# Patient Record
Sex: Male | Born: 1990
Health system: Southern US, Community
[De-identification: ages and names within clinical notes are randomized; demographics above are authoritative.]

## PROBLEM LIST (undated history)

## (undated) DIAGNOSIS — G4730 Sleep apnea, unspecified: Secondary | ICD-10-CM

## (undated) DIAGNOSIS — K219 Gastro-esophageal reflux disease without esophagitis: Secondary | ICD-10-CM

## (undated) HISTORY — DX: Gastro-esophageal reflux disease without esophagitis: K21.9

---

## 2009-09-02 ENCOUNTER — Emergency Department (HOSPITAL_BASED_OUTPATIENT_CLINIC_OR_DEPARTMENT_OTHER): Admission: EM | Admit: 2009-09-02 | Discharge: 2009-09-02 | Payer: Self-pay | Admitting: Emergency Medicine

## 2011-11-10 ENCOUNTER — Encounter (HOSPITAL_COMMUNITY): Payer: Self-pay | Admitting: *Deleted

## 2011-11-10 ENCOUNTER — Emergency Department (HOSPITAL_COMMUNITY)
Admission: EM | Admit: 2011-11-10 | Discharge: 2011-11-10 | Disposition: A | Discharge status: Home / Self Care | Payer: Self-pay | Attending: Emergency Medicine | Admitting: Emergency Medicine

## 2011-11-10 ENCOUNTER — Emergency Department (HOSPITAL_COMMUNITY): Payer: Self-pay

## 2011-11-10 DIAGNOSIS — R05 Cough: Secondary | ICD-10-CM

## 2011-11-10 DIAGNOSIS — J069 Acute upper respiratory infection, unspecified: Secondary | ICD-10-CM | POA: Insufficient documentation

## 2011-11-10 DIAGNOSIS — Z79899 Other long term (current) drug therapy: Secondary | ICD-10-CM | POA: Insufficient documentation

## 2011-11-10 DIAGNOSIS — J029 Acute pharyngitis, unspecified: Secondary | ICD-10-CM | POA: Insufficient documentation

## 2011-11-10 DIAGNOSIS — R059 Cough, unspecified: Secondary | ICD-10-CM | POA: Insufficient documentation

## 2011-11-10 MED ORDER — HYDROCOD POLST-CHLORPHEN POLST 10-8 MG/5ML PO LQCR: 5.0000 mL | Freq: Two times a day (BID) | ORAL | Status: DC | PRN

## 2011-11-10 MED ORDER — AZITHROMYCIN 250 MG PO TABS: ORAL_TABLET | ORAL | Status: DC

## 2011-11-10 NOTE — ED Provider Notes (Signed)
History     CSN: [id number]  Arrival date & time 11/10/11  1830   First MD Initiated Contact with Patient 11/10/11 2022      Chief Complaint  Patient presents with  . URI  . Sore Throat    (Consider location/radiation/quality/duration/timing/severity/associated sxs/prior treatment) HPI Comments: URI sx for 2 weeks. Cough, nasal congestion and sore throat, Improvement noted 1 week ago, then worsening. No fever, ear pain, HA, N/V/D. Cough non-productive. Mucinex and other OTC meds at home without much relief. Patient's son is sick. Onset gradual. Course constant. Nothing makes symptoms worse.   Patient is a 21 y.o. male presenting with URI and pharyngitis. The history is provided by the patient.  URI  Sore Throat    History reviewed. No pertinent past medical history.  History reviewed. No pertinent past surgical history.  No family history on file.  History  Substance Use Topics  . Smoking status: Never Smoker   . Smokeless tobacco: Not on file  . Alcohol Use: Yes      Review of Systems  All other systems reviewed and are negative.    Allergies  Review of patient's allergies indicates no known allergies.  Home Medications   Current Outpatient Rx  Name Route Sig Dispense Refill  . VICKS VAPORUB EX Apply externally Apply 1 application topically daily as needed. For cold symptoms    . MUCINEX PO Oral Take 2 tablets by mouth every 12 (twelve) hours as needed. For cold symptoms      BP 137/78  Pulse 66  Temp 98.1 F (36.7 C) (Oral)  Resp 20  Ht 6\' 1"  (1.854 m)  Wt 208 lb (94.348 kg)  BMI 27.44 kg/m2  SpO2 99%  Physical Exam  Nursing note and vitals reviewed. Constitutional: He appears well-developed and well-nourished.  HENT:  Head: Normocephalic and atraumatic. No trismus in the jaw.  Right Ear: Tympanic membrane, external ear and ear canal normal.  Left Ear: Tympanic membrane, external ear and ear canal normal.  Nose: Mucosal edema and rhinorrhea  present. Right sinus exhibits maxillary sinus tenderness. Left sinus exhibits maxillary sinus tenderness.  Mouth/Throat: Uvula is midline and mucous membranes are normal. Mucous membranes are not dry. No uvula swelling. Posterior oropharyngeal erythema present. No oropharyngeal exudate, posterior oropharyngeal edema or tonsillar abscesses.  Eyes: Conjunctivae normal are normal. Right eye exhibits no discharge. Left eye exhibits no discharge.  Neck: Normal range of motion. Neck supple.  Cardiovascular: Normal rate, regular rhythm and normal heart sounds.   Pulmonary/Chest: Effort normal and breath sounds normal. No respiratory distress. He has no wheezes. He has no rales.  Abdominal: Soft. There is no tenderness.  Neurological: He is alert.  Skin: Skin is warm and dry.  Psychiatric: He has a normal mood and affect.    ED Course  Procedures (including critical care time)  Labs Reviewed - No data to display Dg Chest 2 View  11/10/2011  *RADIOLOGY REPORT*  Clinical Data: 21 year old male upper respiratory tract infection. Cough congestion, sore throat.  CHEST - 2 VIEW  Comparison: None.  Findings: Normal lung volumes. Normal cardiac size and mediastinal contours.  Visualized tracheal air column is within normal limits. Mild diffuse increased interstitial markings, but otherwise no abnormal pulmonary opacity.  No pneumothorax or effusion. No acute osseous abnormality identified.  IMPRESSION: Mild diffuse increased interstitial markings suspicious for viral / atypical respiratory infection in this setting.  No focal pneumonia.   Original Report Authenticated By: Harley Hallmark, M.D.  1. URI (upper respiratory infection)   2. Cough     8:37 PM Patient seen and examined. Informed of CXR results.   Vital signs reviewed and are as follows: Filed Vitals:   11/10/11 1848  BP: 137/78  Pulse: 66  Temp: 98.1 F (36.7 C)  Resp: 20   Patient counseled on supportive care for URI and s/s to  return including worsening symptoms, persistent fever, persistent vomiting, or if they have any other concerns.  Urged to see PCP if symptoms persist for more than 3 days. Patient verbalizes understanding and agrees with plan.   Patient counseled on use of narcotic cough medications. Counseled not to combine these medications with others containing tylenol. Urged not to drink alcohol, drive, or perform any other activities that requires focus while taking these medications. The patient verbalizes understanding and agrees with the plan.     MDM  Sinusitis/bronchitis: given duration and pattern, will treat with abx. Patient appears non-toxic and otherwise well.        Renne Crigler, [place] 11/12/11 [phone]

## 2011-11-10 NOTE — ED Notes (Signed)
Patient reports he has had cold sx for 2 weeks.  He has also had a sore throat.  He reports he felt light headed while at work.  Patient reports he has a cough with yellow/green production.  Patient denies back pain

## 2011-11-13 NOTE — ED Provider Notes (Signed)
Medical screening examination/treatment/procedure(s) were performed by non-physician practitioner and as supervising physician I was immediately available for consultation/collaboration.  Martha K Linker, MD 11/13/11 1205 

## 2012-01-08 ENCOUNTER — Encounter (HOSPITAL_COMMUNITY): Payer: Self-pay | Admitting: *Deleted

## 2012-01-08 ENCOUNTER — Emergency Department (HOSPITAL_COMMUNITY)
Admission: EM | Admit: 2012-01-08 | Discharge: 2012-01-08 | Disposition: A | Discharge status: Home / Self Care | Payer: Self-pay | Attending: Emergency Medicine | Admitting: Emergency Medicine

## 2012-01-08 DIAGNOSIS — N342 Other urethritis: Secondary | ICD-10-CM | POA: Insufficient documentation

## 2012-01-08 DIAGNOSIS — N509 Disorder of male genital organs, unspecified: Secondary | ICD-10-CM | POA: Insufficient documentation

## 2012-01-08 DIAGNOSIS — R300 Dysuria: Secondary | ICD-10-CM | POA: Insufficient documentation

## 2012-01-08 DIAGNOSIS — A749 Chlamydial infection, unspecified: Secondary | ICD-10-CM | POA: Insufficient documentation

## 2012-01-08 DIAGNOSIS — N453 Epididymo-orchitis: Secondary | ICD-10-CM | POA: Insufficient documentation

## 2012-01-08 DIAGNOSIS — Z79899 Other long term (current) drug therapy: Secondary | ICD-10-CM | POA: Insufficient documentation

## 2012-01-08 DIAGNOSIS — N451 Epididymitis: Secondary | ICD-10-CM

## 2012-01-08 LAB — URINALYSIS, ROUTINE W REFLEX MICROSCOPIC
Bilirubin Urine: NEGATIVE
Hgb urine dipstick: NEGATIVE
Nitrite: NEGATIVE
Specific Gravity, Urine: 1.02 (ref 1.005–1.030)
pH: 6 (ref 5.0–8.0)

## 2012-01-08 LAB — CBC WITH DIFFERENTIAL/PLATELET
Hemoglobin: 15.8 g/dL (ref 13.0–17.0)
Lymphocytes Relative: 19 % (ref 12–46)
Lymphs Abs: 1.3 10*3/uL (ref 0.7–4.0)
Monocytes Relative: 7 % (ref 3–12)
Neutro Abs: 5 10*3/uL (ref 1.7–7.7)
Neutrophils Relative %: 73 % (ref 43–77)
RBC: 5.42 MIL/uL (ref 4.22–5.81)
WBC: 7 10*3/uL (ref 4.0–10.5)

## 2012-01-08 LAB — URINE MICROSCOPIC-ADD ON

## 2012-01-08 LAB — BASIC METABOLIC PANEL
BUN: 16 mg/dL (ref 6–23)
Chloride: 103 mEq/L (ref 96–112)
Glucose, Bld: 98 mg/dL (ref 70–99)
Potassium: 4.2 mEq/L (ref 3.5–5.1)

## 2012-01-08 MED ORDER — HYDROCODONE-ACETAMINOPHEN 5-325 MG PO TABS
1.0000 | ORAL_TABLET | Freq: Once | ORAL | Status: AC
  Administered 2012-01-08: 1 via ORAL
  Filled 2012-01-08: qty 1

## 2012-01-08 MED ORDER — DOXYCYCLINE HYCLATE 100 MG PO TABS
100.0000 mg | ORAL_TABLET | Freq: Once | ORAL | Status: AC
  Administered 2012-01-08: 100 mg via ORAL
  Filled 2012-01-08: qty 1

## 2012-01-08 MED ORDER — DOXYCYCLINE HYCLATE 100 MG PO CAPS: 100.0000 mg | ORAL_CAPSULE | Freq: Two times a day (BID) | ORAL | Status: DC

## 2012-01-08 MED ORDER — CEFTRIAXONE SODIUM 250 MG IJ SOLR
250.0000 mg | Freq: Once | INTRAMUSCULAR | Status: AC
  Administered 2012-01-08: 250 mg via INTRAMUSCULAR
  Filled 2012-01-08: qty 250

## 2012-01-08 MED ORDER — LIDOCAINE HCL (PF) 1 % IJ SOLN
INTRAMUSCULAR | Status: AC
  Filled 2012-01-08: qty 5

## 2012-01-08 NOTE — ED Notes (Signed)
PT is here with lower abdominal pain that has been going on for a couple of days..  Pt has pain with urination, denies penile discharge.  No vomiting or diarrhea

## 2012-01-08 NOTE — ED Notes (Signed)
Patient complaining of lower left and right abdominal pain that started two days ago.  Rates pain 5/10; describes pain as "cramping" and "aching".  Patient denies nausea, vomiting, diarrhea, chest pain, and shortness of breath.  Last bowel movement today; normal.  Patient alert and oriented x4; PERRL present.  Upon arrival to room, patient changed into gown and connected to continuous pulse ox and blood pressure monitor.  Will continue to monitor.

## 2012-01-08 NOTE — ED Notes (Signed)
Pt does report some constipation

## 2012-01-08 NOTE — ED Provider Notes (Signed)
History     CSN: [id number]  Arrival date & time 01/08/12  1710   First MD Initiated Contact with Patient 01/08/12 1855      Chief Complaint  Patient presents with  . Abdominal Pain    (Consider location/radiation/quality/duration/timing/severity/associated sxs/prior treatment) Patient is a 21 y.o. male presenting with general illness. The history is provided by the patient. No language interpreter was used.  Illness  The current episode started 3 to 5 days ago. The problem occurs continuously. The problem has been gradually worsening. The problem is moderate. Nothing relieves the symptoms. Nothing aggravates the symptoms. Associated symptoms include abdominal pain. Pertinent negatives include no fever, no constipation, no diarrhea, no nausea, no vomiting, no congestion, no headaches, no sore throat, no cough and no rash.    History reviewed. No pertinent past medical history.  History reviewed. No pertinent past surgical history.  No family history on file.  History  Substance Use Topics  . Smoking status: Never Smoker   . Smokeless tobacco: Not on file  . Alcohol Use: Yes      Review of Systems  Constitutional: Negative for fever and chills.  HENT: Negative for congestion and sore throat.   Respiratory: Negative for cough and shortness of breath.   Cardiovascular: Negative for chest pain and leg swelling.  Gastrointestinal: Positive for abdominal pain. Negative for nausea, vomiting, diarrhea and constipation.  Genitourinary: Positive for dysuria and testicular pain. Negative for frequency.  Skin: Negative for color change and rash.  Neurological: Negative for dizziness and headaches.  Psychiatric/Behavioral: Negative for confusion and agitation.  All other systems reviewed and are negative.    Allergies  Review of patient's allergies indicates no known allergies.  Home Medications   Current Outpatient Rx  Name  Route  Sig  Dispense  Refill  . AZITHROMYCIN  250 MG PO TABS      Take two tablets PO on day 1 and one tablet PO days 2-5   6 tablet   0   . VICKS VAPORUB EX   Apply externally   Apply 1 application topically daily as needed. For cold symptoms         . HYDROCOD POLST-CPM POLST ER 10-8 MG/5ML PO LQCR   Oral   Take 5 mLs by mouth every 12 (twelve) hours as needed.   115 mL   0   . MUCINEX PO   Oral   Take 2 tablets by mouth every 12 (twelve) hours as needed. For cold symptoms           BP 132/79  Pulse 69  Temp 99.2 F (37.3 C) (Oral)  Resp 18  SpO2 97%  Physical Exam  Constitutional: He is oriented to person, place, and time. He appears well-developed and well-nourished. No distress.  HENT:  Head: Normocephalic and atraumatic.  Eyes: EOM are normal. Pupils are equal, round, and reactive to light.  Neck: Normal range of motion. Neck supple.  Cardiovascular: Normal rate and regular rhythm.   Pulmonary/Chest: Effort normal. No respiratory distress.  Abdominal: Soft. He exhibits no distension. There is no tenderness. Hernia confirmed negative in the right inguinal area and confirmed negative in the left inguinal area.  Genitourinary: Cremasteric reflex is present. Right testis shows tenderness. Right testis shows no mass and no swelling. Right testis is descended. Cremasteric reflex is not absent on the right side. Left testis shows swelling and tenderness. Left testis shows no mass. Left testis is descended. Cremasteric reflex is not absent on the  left side. Circumcised. No penile erythema. No discharge found.  Musculoskeletal: Normal range of motion. He exhibits no edema.  Neurological: He is alert and oriented to person, place, and time.  Skin: Skin is warm and dry.  Psychiatric: He has a normal mood and affect. His behavior is normal.    ED Course  Procedures (including critical care time)  Results for orders placed during the hospital encounter of 01/08/12  CBC WITH DIFFERENTIAL      Component Value Range    WBC 7.0  4.0 - 10.5 K/uL   RBC 5.42  4.22 - 5.81 MIL/uL   Hemoglobin 15.8  13.0 - 17.0 g/dL   HCT 14.7  82.9 - 56.2 %   MCV 86.2  78.0 - 100.0 fL   MCH 29.2  26.0 - 34.0 pg   MCHC 33.8  30.0 - 36.0 g/dL   RDW 13.0  86.5 - 78.4 %   Platelets 228  150 - 400 K/uL   Neutrophils Relative 73  43 - 77 %   Neutro Abs 5.0  1.7 - 7.7 K/uL   Lymphocytes Relative 19  12 - 46 %   Lymphs Abs 1.3  0.7 - 4.0 K/uL   Monocytes Relative 7  3 - 12 %   Monocytes Absolute 0.5  0.1 - 1.0 K/uL   Eosinophils Relative 1  0 - 5 %   Eosinophils Absolute 0.1  0.0 - 0.7 K/uL   Basophils Relative 0  0 - 1 %   Basophils Absolute 0.0  0.0 - 0.1 K/uL  BASIC METABOLIC PANEL      Component Value Range   Sodium 141  135 - 145 mEq/L   Potassium 4.2  3.5 - 5.1 mEq/L   Chloride 103  96 - 112 mEq/L   CO2 29  19 - 32 mEq/L   Glucose, Bld 98  70 - 99 mg/dL   BUN 16  6 - 23 mg/dL   Creatinine, Ser 6.96  0.50 - 1.35 mg/dL   Calcium 9.6  8.4 - 29.5 mg/dL   GFR calc non Af Amer >90  >90 mL/min   GFR calc Af Amer >90  >90 mL/min  URINALYSIS, ROUTINE W REFLEX MICROSCOPIC      Component Value Range   Color, Urine YELLOW  YELLOW   APPearance CLEAR  CLEAR   Specific Gravity, Urine 1.020  1.005 - 1.030   pH 6.0  5.0 - 8.0   Glucose, UA NEGATIVE  NEGATIVE mg/dL   Hgb urine dipstick NEGATIVE  NEGATIVE   Bilirubin Urine NEGATIVE  NEGATIVE   Ketones, ur NEGATIVE  NEGATIVE mg/dL   Protein, ur NEGATIVE  NEGATIVE mg/dL   Urobilinogen, UA 0.2  0.0 - 1.0 mg/dL   Nitrite NEGATIVE  NEGATIVE   Leukocytes, UA MODERATE (*) NEGATIVE  URINE MICROSCOPIC-ADD ON      Component Value Range   WBC, UA 11-20  <3 WBC/hpf   RBC / HPF 0-2  <3 RBC/hpf   Bacteria, UA RARE  RARE    No results found.   No diagnosis found.    MDM  Pt w/ no PMHx now w/ suprapubic abdominal pain and dysuria x several days. Pain is sharp, intermittent, non radiating. Pt is In monogamous relationship, + unprotected sex, unknown infidelity in partner. Denies  hematuria or change in UOP. Admits to mild bilateral testicular aching. Denies n/v/d/c. No fever. Vitals normal, abdomen is soft and benign, nttp, no clinical peritonitis. Doubt appendicitis. Testicles nttp, mild ttp bilat  epididymus, + cremasteric. No urethral drainage.   Concern for STD - gonorrhea/chlamydia, epididymitis. Doubt torsion. Will check u/a, gc/chlamydia, cbc,  Bmp and treat empirically w/ rocephin and doxy.   Reassessed, bmp and cbc unremarkable. U/a clean. Stable for d/c home. Given rx for doxycycline. Given return precautions and follow up instructions.   1. Urethritis   2. Epididymitis    New Prescriptions   DOXYCYCLINE (VIBRAMYCIN) 100 MG CAPSULE    Take 1 capsule (100 mg total) by mouth 2 (two) times daily. One po bid x 10 days   Provider Default, MD [address] [place] [address] [phone]    [facility] EMERGENCY DEPARTMENT [address] [id number] mc [place] [address] [phone]           Audelia Hives, MD 01/09/12 [phone]

## 2012-01-09 NOTE — ED Provider Notes (Signed)
I saw and evaluated the patient, reviewed the resident's note and I agree with the findings and plan. The patient presents with dysuria and lower abd pain.  Reports unprotected sex with partner.  Concerned about std.  On exam the patient is afebrile and vitals are stable. The genitalia appears normal. There is no abdominal ttp.  Will treat presumptively for std as the urine shows wbcs.  Swabs taken.  Geoffery Lyons, MD 01/09/12 2259

## 2012-01-11 LAB — GC/CHLAMYDIA PROBE AMP: GC Probe RNA: NEGATIVE

## 2012-01-12 ENCOUNTER — Telehealth (HOSPITAL_COMMUNITY): Payer: Self-pay | Admitting: Emergency Medicine

## 2012-01-12 NOTE — ED Notes (Signed)
+  Chlamydia. Patient treated with Rocephin and Doxycycline. Per protocol MD. [facility] faxed.

## 2013-01-10 ENCOUNTER — Emergency Department (HOSPITAL_COMMUNITY): Payer: Self-pay

## 2013-01-10 ENCOUNTER — Encounter (HOSPITAL_COMMUNITY): Payer: Self-pay | Admitting: Emergency Medicine

## 2013-01-10 ENCOUNTER — Emergency Department (HOSPITAL_COMMUNITY)
Admission: EM | Admit: 2013-01-10 | Discharge: 2013-01-10 | Disposition: A | Discharge status: Home / Self Care | Payer: Self-pay | Attending: Emergency Medicine | Admitting: Emergency Medicine

## 2013-01-10 DIAGNOSIS — B9789 Other viral agents as the cause of diseases classified elsewhere: Secondary | ICD-10-CM

## 2013-01-10 DIAGNOSIS — J069 Acute upper respiratory infection, unspecified: Secondary | ICD-10-CM | POA: Insufficient documentation

## 2013-01-10 MED ORDER — ALBUTEROL SULFATE HFA 108 (90 BASE) MCG/ACT IN AERS: 1.0000 | INHALATION_SPRAY | Freq: Four times a day (QID) | RESPIRATORY_TRACT | Status: DC | PRN

## 2013-01-10 MED ORDER — HYDROCOD POLST-CHLORPHEN POLST 10-8 MG/5ML PO LQCR: 5.0000 mL | Freq: Two times a day (BID) | ORAL | Status: DC | PRN

## 2013-01-10 NOTE — ED Provider Notes (Signed)
CSN: [id number]     Arrival date & time 01/10/13  1107 History   None   This chart was scribed for Tyler Dredge PA-C, a non-physician practitioner working with Ethelda Chick, MD by Lewanda Rife, ED Scribe. This patient was seen in room TR06C/TR06C and the patient's care was started at 12:39 PM     Chief Complaint  Patient presents with  . URI   (Consider location/radiation/quality/duration/timing/severity/associated sxs/prior Treatment) The history is provided by the patient. No language interpreter was used.   HPI Comments: Tyler Howard is a 22 y.o. male who presents to the Emergency Department complaining of constant improving sore throat onset 7 days. Reports associated congestion, rhinorrhea, and productive cough with yellow sputum Reports cough is exacerbated at night. Reports symptoms are mildly alleviated by OTC cold medications. Denies associated shortness of breath, fever, and generalized myalgias. Denies known sick contacts.   History reviewed. No pertinent past medical history. History reviewed. No pertinent past surgical history. History reviewed. No pertinent family history. History  Substance Use Topics  . Smoking status: Never Smoker   . Smokeless tobacco: Not on file  . Alcohol Use: Yes    Review of Systems  Constitutional: Negative for fever.  HENT: Positive for congestion and rhinorrhea.   Respiratory: Positive for cough. Negative for shortness of breath.   Musculoskeletal: Negative for myalgias.  Psychiatric/Behavioral: Negative for confusion.    Allergies  Review of patient's allergies indicates no known allergies.  Home Medications   Current Outpatient Rx  Name  Route  Sig  Dispense  Refill  . Camphor-Eucalyptus-Menthol (VICKS VAPORUB EX)   Apply externally   Apply 1 application topically daily as needed. For cold symptoms         . Pheniramine-PE-APAP (THERAFLU FLU & SORE THROAT) 20-10-650 MG PACK   Oral   Take 1 packet by mouth every 6  (six) hours as needed (Flu/cold symptoms).         . Pseudoeph-Doxylamine-DM-APAP (NYQUIL PO)   Oral   Take 30 mLs by mouth once.          BP 141/89  Pulse 74  Temp(Src) 98 F (36.7 C) (Oral)  Resp 16  Ht 6\' 1"  (1.854 m)  Wt 218 lb 1.6 oz (98.93 kg)  BMI 28.78 kg/m2  SpO2 98% Physical Exam  Nursing note and vitals reviewed. Constitutional: He appears well-developed and well-nourished. No distress.  HENT:  Head: Normocephalic and atraumatic.  Nose: Right sinus exhibits no maxillary sinus tenderness and no frontal sinus tenderness. Left sinus exhibits no maxillary sinus tenderness and no frontal sinus tenderness.  Mouth/Throat: Oropharynx is clear and moist. No oropharyngeal exudate.  Neck: Neck supple.  Cardiovascular: Normal rate and regular rhythm.   Pulmonary/Chest: Effort normal and breath sounds normal. No respiratory distress. He has no wheezes. He has no rales.  Neurological: He is alert. He exhibits normal muscle tone.  Skin: He is not diaphoretic.    ED Course  Procedures  COORDINATION OF CARE:  Nursing notes reviewed. Vital signs reviewed. Initial pt interview and examination performed.   12:39 PM-Discussed work up plan with pt at bedside, which includes CXR. Pt agrees with plan.  1:46 PM Nursing Notes Reviewed/ Care Coordinated Applicable Imaging Reviewed incorporated into ED treatment Discussed results and treatment plan with pt. Pt demonstrates understanding and agrees with plan.  Treatment plan initiated:Medications - No data to display   Initial diagnostic testing ordered.    Labs Review Labs Reviewed - No data to display  Imaging Review Dg Chest 2 View  01/10/2013   CLINICAL DATA:  One-week history of cough and chest congestion and sore throat  EXAM: CHEST  2 VIEW  COMPARISON:  PA and lateral chest of November 10, 2011.  FINDINGS: The lungs are adequately inflated. The interstitial markings are mildly increased but these are not entirely new. There  is no alveolar infiltrate. There is no pleural effusion or pulmonary vascular congestion. The cardiac silhouette is normal in size. The observed portions of the bony thorax appear normal.  IMPRESSION: There is no alveolar pneumonia. Mildly increased interstitial markings may reflect a viral pneumonitis. These findings are not greatly changed from the earlier study at which time the patient had similar symptoms by history.   Electronically Signed   By: David  [place]   On: 01/10/2013 13:17    EKG Interpretation   None       MDM   1. Viral respiratory illness     Pt with 1 week of URI symptoms.  Exam unremarkable.  Pt very concerned about pneumonia, requests CXR.  CXR negative.  D/C home with symptomatic treatment.  Discussed result, findings, treatment, and follow up  with patient.  Pt given return precautions.  Pt verbalizes understanding and agrees with plan.      I personally performed the services described in this documentation, which was scribed in my presence. The recorded information has been reviewed and is accurate.    Tyler Dredge, PA-C 01/10/13 1502

## 2013-01-10 NOTE — ED Notes (Signed)
Per pt sts cold symptoms x 1 weeks. Cold medication gives relief

## 2013-01-10 NOTE — ED Notes (Addendum)
Pt c/o cold symptoms ongoing x 1 week.  Pt wants to make sure he doesn't have the flu or pneumonia.  Pt has not taken any meds today.

## 2013-01-10 NOTE — ED Provider Notes (Signed)
Medical screening examination/treatment/procedure(s) were performed by non-physician practitioner and as supervising physician I was immediately available for consultation/collaboration.  EKG Interpretation   None        Ethelda Chick, MD 01/10/13 1505

## 2013-03-09 ENCOUNTER — Emergency Department (HOSPITAL_COMMUNITY)
Admission: EM | Admit: 2013-03-09 | Discharge: 2013-03-10 | Disposition: A | Discharge status: Home / Self Care | Payer: 59 | Attending: Emergency Medicine | Admitting: Emergency Medicine

## 2013-03-09 ENCOUNTER — Encounter (HOSPITAL_COMMUNITY): Payer: Self-pay | Admitting: Emergency Medicine

## 2013-03-09 ENCOUNTER — Emergency Department (HOSPITAL_COMMUNITY): Payer: 59

## 2013-03-09 DIAGNOSIS — J189 Pneumonia, unspecified organism: Secondary | ICD-10-CM | POA: Insufficient documentation

## 2013-03-09 DIAGNOSIS — Z79899 Other long term (current) drug therapy: Secondary | ICD-10-CM | POA: Insufficient documentation

## 2013-03-09 DIAGNOSIS — R0602 Shortness of breath: Secondary | ICD-10-CM | POA: Insufficient documentation

## 2013-03-09 MED ORDER — AZITHROMYCIN 250 MG PO TABS: 250.0000 mg | ORAL_TABLET | Freq: Every day | ORAL | Status: DC

## 2013-03-09 NOTE — ED Notes (Signed)
PT reports several weeks of cold like symptoms . Cough is productive ,Pt denies chills or fever.

## 2013-03-09 NOTE — ED Notes (Signed)
ekg done

## 2013-03-09 NOTE — ED Provider Notes (Signed)
CSN: [id number]     Arrival date & time 03/09/13  2146 History  This chart was scribed for Tyler FinnerErin O'Malley, PA-C, non-physician practitioner working with Celene KrasJon R Knapp, MD by Nicholos Johnsenise Iheanachor, ED scribe. This patient was seen in room TR04C/TR04C and the patient's care was started at 11:09 PM.  Chief Complaint  Patient presents with  . Cough   The history is provided by the patient. No language interpreter was used.   HPI Comments: Tyler ConradiJeffery Howard is a 23 y.o. male who presents to the Emergency Department complaining of SOB and productive cough. Diagnosed here 1 month ago with URI and states sx have been intermittent since. States sx started getting better a couple of weeks ago and have recently started getting worse. Has been taking albuterol treatments and nyquil with little relief. No hx of asthma. Denies any sick contacts. Pt is not a smoker. Denies fever, nausea, or vomiting.   History reviewed. No pertinent past medical history. History reviewed. No pertinent past surgical history. History reviewed. No pertinent family history. History  Substance Use Topics  . Smoking status: Never Smoker   . Smokeless tobacco: Not on file  . Alcohol Use: Yes    Review of Systems  Respiratory: Positive for cough and shortness of breath.   All other systems reviewed and are negative.   Allergies  Review of patient's allergies indicates no known allergies.  Home Medications   Current Outpatient Rx  Name  Route  Sig  Dispense  Refill  . albuterol (PROVENTIL HFA;VENTOLIN HFA) 108 (90 BASE) MCG/ACT inhaler   Inhalation   Inhale 1-2 puffs into the lungs every 6 (six) hours as needed for wheezing or shortness of breath.   1 Inhaler   0   . Pheniramine-PE-APAP (THERAFLU FLU & SORE THROAT) 20-10-650 MG PACK   Oral   Take 1 packet by mouth every 6 (six) hours as needed (Flu/cold symptoms).         . Pseudoeph-Doxylamine-DM-APAP (NYQUIL PO)   Oral   Take 30 mLs by mouth once.         Marland Kitchen.  azithromycin (ZITHROMAX) 250 MG tablet   Oral   Take 1 tablet (250 mg total) by mouth daily. Take first 2 tablets together, then 1 every day until finished.   6 tablet   0    Triage Vitals: BP 128/74  Pulse 79  Temp(Src) 98 F (36.7 C) (Oral)  Resp 16  Ht 6' (1.829 m)  Wt 225 lb (102.059 kg)  BMI 30.51 kg/m2  SpO2 96%  Physical Exam  Nursing note and vitals reviewed. Constitutional: He is oriented to person, place, and time. He appears well-developed and well-nourished.  Pt appears well, non-toxic. NAD  HENT:  Head: Normocephalic and atraumatic.  Nose: Mucosal edema present.  Mouth/Throat: Oropharynx is clear and moist.  Eyes: EOM are normal.  Neck: Normal range of motion.  Cardiovascular: Normal rate, regular rhythm and normal heart sounds.   Pulmonary/Chest: Effort normal and breath sounds normal. No respiratory distress. He has no wheezes. He has no rales. He exhibits no tenderness.  No respiratory distress, able to speak in full sentences w/o difficulty. Lungs: CTAB  Abdominal: Soft. He exhibits no distension. There is no tenderness.  Musculoskeletal: Normal range of motion.  Neurological: He is alert and oriented to person, place, and time.  Skin: Skin is warm and dry.  Psychiatric: He has a normal mood and affect. His behavior is normal.   ED Course  Procedures (including critical  care time) DIAGNOSTIC STUDIES: Oxygen Saturation is 96% on room air, normal by my interpretation.    COORDINATION OF CARE: At 11:12 PM: Discussed treatment plan with patient which includes a chest x-ray. Patient agrees.   Labs Review Labs Reviewed - No data to display Imaging Review Dg Chest 2 View  03/09/2013   CLINICAL DATA:  Shortness of breath.  EXAM: CHEST  2 VIEW  COMPARISON:  Chest radiograph performed 01/10/2013  FINDINGS: The lungs are well-aerated. Mild left-sided airspace passed raises question for mild pneumonia. There is no evidence of pleural effusion or pneumothorax.  The  heart is normal in size; the mediastinal contour is within normal limits. No acute osseous abnormalities are seen.  IMPRESSION: Mild left-sided airspace opacification raises question for mild pneumonia.   Electronically Signed   By: Roanna Raider M.D.   On: 03/09/2013 23:44    EKG Interpretation   None       MDM   Final diagnoses:  Pneumonia involving left lung   Pt presenting with 57mo hx of intermittent cough and URI symptoms. Pt appears well, non-toxic, NAD. Lungs: CTAB  CXR due to duration of symptoms w/o relief. No hx of asthma. CXR: mild-left sided airspace opacification, question for mild pneumonia.  Due to duration of symptoms will tx. Rx: azithromycin. Advised to f/u with primary care provider as needed for continued symptoms.  Return precautions provided. Pt verbalized understanding and agreement with tx plan.   I personally performed the services described in this documentation, which was scribed in my presence. The recorded information has been reviewed and is accurate.      Tyler Finner, PA-C 03/10/13 [phone]

## 2013-03-10 NOTE — ED Provider Notes (Signed)
Medical screening examination/treatment/procedure(s) were performed by non-physician practitioner and as supervising physician I was immediately available for consultation/collaboration.    Tyler KrasJon R Tyler Vitelli, MD 03/10/13 [phone]

## 2013-03-10 NOTE — Discharge Instructions (Signed)
Emergency Department Resource Guide °1) Find a Doctor and Pay Out of Pocket °Although you won't have to find out who is covered by your insurance plan, it is a good idea to ask around and get recommendations. You will then need to call the office and see if the doctor you have chosen will accept you as a new patient and what types of options they offer for patients who are self-pay. Some doctors offer discounts or will set up payment plans for their patients who do not have insurance, but you will need to ask so you aren't surprised when you get to your appointment. ° °2) Contact Your Local Health Department °Not all health departments have doctors that can see patients for sick visits, but many do, so it is worth a call to see if yours does. If you don't know where your local health department is, you can check in your phone book. The CDC also has a tool to help you locate your state's health department, and many state websites also have listings of all of their local health departments. ° °3) Find a Walk-in Clinic °If your illness is not likely to be very severe or complicated, you may want to try a walk in clinic. These are popping up all over the country in pharmacies, drugstores, and shopping centers. They're usually staffed by nurse practitioners or physician assistants that have been trained to treat common illnesses and complaints. They're usually fairly quick and inexpensive. However, if you have serious medical issues or chronic medical problems, these are probably not your best option. ° °No Primary Care Doctor: °- Call [facility] at  832-8000 - they can help you locate a primary care doctor that  accepts your insurance, provides certain services, etc. °- Physician Referral Service- 1-800-533-3463 ° °Chronic Pain Problems: °Organization         Address  Phone   Notes  °[facility]  ([phone]) [phone] Patients need to be referred by their primary care doctor.  ° °Medication  Assistance: °Organization         Address  Phone   Notes  °[place] [facility] [address] °[place], [place] [address] (336) 641-8030 --Must be a resident of [place] °-- Must have NO insurance coverage whatsoever (no Medicaid/ Medicare, etc.) °-- The pt. MUST have a primary care doctor that directs their care regularly and follows them in the community °  °[facility]  (866) 331-1348   °[employer]  (888) 892-1162   ° °Agencies that provide inexpensive medical care: °Organization         Address                                                       Phone                                                                            Notes  °[facility]  (336) 832-8035   °[facility]    (336)   832-7272   °[facility] [address] °[place], [place] [address] (336) 832-4777   °[facility] [address], °[place] (336) 271-4999   °[facility]    (336) 373-0678   °[facility]    (336) 272-1050   °[facility] ° [address], [place] Phone:  (336) 832-4444, Fax:  (336) 832-4440 Hours of Operation:  9 am - 6 pm, M-F.  Also accepts Medicaid/Medicare and self-pay.  °[facility] ° [address], [place] Phone: (336) [phone], Fax: (336) [phone]. Hours of Operation:  8:30 am - 5:30 pm, M-F.  Also accepts Medicaid and self-pay.  °[facility] [place] [address], [place] Phone: (336) 878-6027   °[facility] [address], [place], [place] (336)723-1848, Ext. 123 Mondays & Thursdays: 7-9 AM.  First 15 patients are seen on a first come, first serve basis. °  ° °Medicaid-accepting [place] Providers: ° °Organization         Address                                                                       Phone                               Notes  °[facility] [address], [place] (336) [phone] Also accepts self-pay patients.  °[facility] [address], [place] ° (336) [phone]   °[facility] [address], [place] (336) 288-8857   °[facility] [address] [place] [address], [place] (336) 299-7000   °[facility] [address], [place]  ° (336) [phone] Only accepts [place] Access Medicaid patients after they have their name applied to their card.  ° °Self-Pay (no insurance) in [place]: °  °Organization         Address                                                     Phone               Notes  °Sickle Cell Patients, [facility] [address], [place] (336) [phone]   °[facility] [address], [place] (336) 832-4400   °[facility] ° [address], [place] (336) 992-4800   °[facility]/Dr. Osei-Bonsu ° [address] [place] [address], [place] or [address], [place] (336) [phone] Phone number for both [place] and [place] locations is the same.  °[facility] [address], [place] (336) [phone]   °[facility] [address] [place] [address], [place] or [address] (336) 852-7530 °(336) 878-2260   °[facility] [address],  (336) 350-1642, phone; (336) 294-5005, fax Sees patients 1st and 3rd Saturday of   every month.  Must not qualify for public or private insurance (i.e. Medicaid, Medicare, [place] Health Choice, Veterans' Benefits) • Household income should be no more than 200% of the poverty level •The clinic cannot treat you if you are pregnant or think you are pregnant • Sexually transmitted diseases are not treated at the clinic.  ° °_____________Dental Care:______________ °Organization         Address                                  Phone                       Notes  °[place]  [facility] [address], [place] (336) 641-6152 Accepts children up to age 21 who are enrolled in Medicaid or [place] Health Choice; pregnant women with a Medicaid card; and children who have applied for Medicaid or [place] Health Choice, but were declined, whose parents can pay a reduced fee at time of service.  °[facility] [place]  [address], [place] (336) 641-7733 Accepts children up to age 21 who are enrolled in Medicaid or [place] Health Choice; pregnant women with a Medicaid card; and children who have applied for Medicaid or [place] Health Choice, but were declined, whose parents can pay a reduced fee at time of service.  °[facility] ° [address], [place] (336) 641-4533 Patients are seen by appointment only. Walk-ins are not accepted. [facility] will see patients 18 years of age and older. °Monday - Tuesday (8am-5pm) °Most Wednesdays (8:30-5pm) °$30 per visit, cash only  °[facility] ° [address], [place] (336) [phone] Patients are seen by appointment only. Walk-ins are not accepted. [facility] will see patients 18 years of age and older. °One Wednesday Evening (Monthly: Volunteer Based).  $30 per visit, cash only  °[facility]  ([phone]) [phone] for adults; Children under age 4, call Graduate Pediatric Dentistry at ([phone]) [phone]. Children aged 4-14, please call ([phone]) [phone] to request a pediatric application. ° Dental services are provided in all areas of dental care including fillings, crowns and bridges, complete and partial dentures, implants, gum treatment, root canals, and extractions. Preventive care is also provided. Treatment is provided to both adults and children. °Patients are selected via a lottery and there is often a waiting list. °  °[facility] [address], °[place] ° (336) 763-8833  [email or web address].[email or web address] °  °[facility] [address], [place], [place] (336)723-1848, Ext. 123 Second and Fourth Thursday of each month, opens at 6:30 AM; Clinic ends at 9 AM.  Patients are seen on a first-come first-served basis, and a limited number are seen during each clinic.  ° °[facility] ° [address], [place],  (336) [phone]   Eligibility Requirements °You must have lived in [place], [place], or [place] counties for at least the last three months. °  You cannot be eligible for state or federal sponsored healthcare insurance, including Veterans Administration, Medicaid, or Medicare. °  You generally cannot be eligible for healthcare insurance through your employer.  °  How to apply: °Eligibility screenings are held every Tuesday and Wednesday afternoon from 1:00 pm until 4:00 pm. You do not need an appointment for the interview!  °  [facility] [address], [place], [place] [phone]   °[facility]  [phone]   °[facility]  [phone]   °[facility]  [phone]   ° °

## 2014-02-20 ENCOUNTER — Encounter (HOSPITAL_COMMUNITY): Payer: Self-pay | Admitting: *Deleted

## 2014-02-20 ENCOUNTER — Emergency Department (HOSPITAL_COMMUNITY)
Admission: EM | Admit: 2014-02-20 | Discharge: 2014-02-20 | Disposition: A | Discharge status: Home / Self Care | Payer: Self-pay | Attending: Emergency Medicine | Admitting: Emergency Medicine

## 2014-02-20 DIAGNOSIS — Z79899 Other long term (current) drug therapy: Secondary | ICD-10-CM | POA: Insufficient documentation

## 2014-02-20 DIAGNOSIS — J3489 Other specified disorders of nose and nasal sinuses: Secondary | ICD-10-CM

## 2014-02-20 DIAGNOSIS — R059 Cough, unspecified: Secondary | ICD-10-CM

## 2014-02-20 DIAGNOSIS — J069 Acute upper respiratory infection, unspecified: Secondary | ICD-10-CM | POA: Insufficient documentation

## 2014-02-20 DIAGNOSIS — R05 Cough: Secondary | ICD-10-CM

## 2014-02-20 LAB — RAPID STREP SCREEN (MED CTR MEBANE ONLY): STREPTOCOCCUS, GROUP A SCREEN (DIRECT): NEGATIVE

## 2014-02-20 MED ORDER — GUAIFENESIN ER 600 MG PO TB12: 600.0000 mg | ORAL_TABLET | Freq: Two times a day (BID) | ORAL | Status: DC | PRN

## 2014-02-20 MED ORDER — PHENOL-GLYCERIN 1.5-33 % MT LIQD
1.0000 | OROMUCOSAL | Status: DC | PRN
Start: 2014-02-20 — End: 2016-05-09

## 2014-02-20 MED ORDER — FLUTICASONE PROPIONATE 50 MCG/ACT NA SUSP: 2.0000 | Freq: Every day | NASAL | Status: DC

## 2014-02-20 NOTE — ED Provider Notes (Signed)
CSN: [id number]     Arrival date & time 02/20/14  [id number] History   First MD Initiated Contact with Patient 02/20/14 [phone]     Chief Complaint  Patient presents with  . URI     (Consider location/radiation/quality/duration/timing/severity/associated sxs/prior Treatment) HPI Comments: Tyler Howard is a 24 y.o. healthy male, who presents to the ED with complaints of URI symptoms 5 days. Symptoms include yellowish green rhinorrhea, a productive cough with yellowish-green sputum, sore throat, and intermittent wheezing. He has tried NyQuil and tylenol cold and flu with some relief of symptoms. His symptoms worsen with "being indoors". He denies any history of asthma, and is a nonsmoker. He has sick contacts at home, stating that his son has similar symptoms. He denies any known allergies, or fevers, chills, chest pain, shortness of breath, drooling, trouble swallowing, ear pain or drainage, sneezing, abdominal pain, nausea, vomiting, diarrhea, dysuria, hematuria, numbness, tingling, weakness, or headaches. Denies any rashes.  Patient is a 24 y.o. male presenting with URI. The history is provided by the patient. No language interpreter was used.  URI Presenting symptoms: congestion, cough, rhinorrhea and sore throat   Presenting symptoms: no ear pain and no fever   Cough:    Cough characteristics:  Productive   Sputum characteristics:  Yellow   Severity:  Moderate   Onset quality:  Gradual   Duration:  5 days   Timing:  Constant   Progression:  Unchanged   Chronicity:  New Rhinorrhea:    Quality:  Green   Severity:  Mild   Duration:  5 days   Timing:  Constant   Progression:  Partially resolved Severity:  Mild Onset quality:  Gradual Duration:  5 days Timing:  Constant Progression:  Unchanged Chronicity:  New Relieved by:  OTC medications (nyquil and tylenol cold and flu) Exacerbated by: being indoors. Ineffective treatments:  None tried Associated symptoms: wheezing (intermittently)    Associated symptoms: no arthralgias, no headaches, no myalgias, no neck pain, no sinus pain, no sneezing and no swollen glands   Risk factors: sick contacts     History reviewed. No pertinent past medical history. History reviewed. No pertinent past surgical history. No family history on file. History  Substance Use Topics  . Smoking status: Never Smoker   . Smokeless tobacco: Never Used  . Alcohol Use: Yes    Review of Systems  Constitutional: Negative for fever and chills.  HENT: Positive for congestion, rhinorrhea and sore throat. Negative for ear discharge, ear pain, sneezing and trouble swallowing.   Eyes: Negative for discharge and itching.  Respiratory: Positive for cough and wheezing (intermittently). Negative for shortness of breath.   Cardiovascular: Negative for chest pain.  Gastrointestinal: Negative for nausea, vomiting, abdominal pain and diarrhea.  Genitourinary: Negative for dysuria and hematuria.  Musculoskeletal: Negative for myalgias, arthralgias and neck pain.  Skin: Negative for rash.  Allergic/Immunologic: Negative for environmental allergies.  Neurological: Negative for weakness, numbness and headaches.   10 Systems reviewed and are negative for acute change except as noted in the HPI.    Allergies  Review of patient's allergies indicates no known allergies.  Home Medications   Prior to Admission medications   Medication Sig Start Date End Date Taking? Authorizing Provider  Pheniramine-PE-APAP (THERAFLU FLU & SORE THROAT) 20-10-650 MG PACK Take 1 packet by mouth every 6 (six) hours as needed (Flu/cold symptoms).   Yes Historical Provider, MD  Pseudoeph-Doxylamine-DM-APAP (NYQUIL PO) Take 30 mLs by mouth once.   Yes Historical Provider, MD  albuterol (PROVENTIL HFA;VENTOLIN HFA) 108 (90 BASE) MCG/ACT inhaler Inhale 1-2 puffs into the lungs every 6 (six) hours as needed for wheezing or shortness of breath. 01/10/13   Trixie Dredge, PA-C  azithromycin  (ZITHROMAX) 250 MG tablet Take 1 tablet (250 mg total) by mouth daily. Take first 2 tablets together, then 1 every day until finished. Patient not taking: Reported on 02/20/2014 03/09/13   Junius Finner, PA-C   BP 136/89 mmHg  Pulse 88  Temp(Src) 98.1 F (36.7 C) (Oral)  Resp 20  SpO2 99% Physical Exam  Constitutional: He is oriented to person, place, and time. Vital signs are normal. He appears well-developed and well-nourished.  Non-toxic appearance. No distress.  Afebrile nontoxic, NAD  HENT:  Head: Normocephalic and atraumatic.  Right Ear: Hearing, tympanic membrane, external ear and ear canal normal.  Left Ear: Hearing, tympanic membrane, external ear and ear canal normal.  Nose: Mucosal edema and rhinorrhea present. Right sinus exhibits no maxillary sinus tenderness and no frontal sinus tenderness. Left sinus exhibits no maxillary sinus tenderness and no frontal sinus tenderness.  Mouth/Throat: Uvula is midline and mucous membranes are normal. No trismus in the jaw. No uvula swelling. Posterior oropharyngeal erythema present. No oropharyngeal exudate, posterior oropharyngeal edema or tonsillar abscesses.  Ears clear bilaterally Nose with mild rhinorrhea and mucosal edema, L>R, mildly erythematous turbinates Oropharynx mildly injected globally, no tonsillar swelling or exudates, uvula midline without swelling, no trismus.   Eyes: Conjunctivae and EOM are normal. Right eye exhibits no discharge. Left eye exhibits no discharge.  Neck: Normal range of motion. Neck supple.  Cardiovascular: Normal rate, regular rhythm, normal heart sounds and intact distal pulses.  Exam reveals no gallop and no friction rub.   No murmur heard. Pulmonary/Chest: Effort normal and breath sounds normal. No respiratory distress. He has no decreased breath sounds. He has no wheezes. He has no rhonchi. He has no rales.  CTAB in all lung fields, no w/r/r, no hypoxia or increased WOB, speaking in full sentences, SpO2  99% on RA, intermittent dry cough noted  Abdominal: Normal appearance. He exhibits no distension.  Musculoskeletal: Normal range of motion.  Lymphadenopathy:       Head (right side): No submandibular and no tonsillar adenopathy present.       Head (left side): No submandibular and no tonsillar adenopathy present.    He has no cervical adenopathy.  No head/neck LAD  Neurological: He is alert and oriented to person, place, and time. He has normal strength. No sensory deficit.  Skin: Skin is warm, dry and intact. No rash noted.  Psychiatric: He has a normal mood and affect.  Nursing note and vitals reviewed.   ED Course  Procedures (including critical care time) Labs Review Labs Reviewed  RAPID STREP SCREEN  CULTURE, GROUP A STREP    Imaging Review No results found.   EKG Interpretation None      MDM   Final diagnoses:  URI (upper respiratory infection)  Cough  Rhinorrhea    24 y.o. male with URI type symptoms. Pt is afebrile with a clear lung exam. Mild rhinorrhea. Likely viral URI. Sore throat with mild injection, likely from post nasal drip, RST negative. Pt is agreeable to symptomatic treatment with close follow up with PCP as needed but spoke at length about emergent changing or worsening of symptoms that should prompt return to ER. Pt voices understanding and is agreeable to plan. Stable at time of discharge. Rx for flonase, mucinex, and chloraseptic spray given.  BP 136/89 mmHg  Pulse 88  Temp(Src) 98.1 F (36.7 C) (Oral)  Resp 20  SpO2 99%  Meds ordered this encounter  Medications  . fluticasone (FLONASE) 50 MCG/ACT nasal spray    Sig: Place 2 sprays into both nostrils daily.    Dispense:  16 g    Refill:  0    Order Specific Question:  Supervising Provider    Answer:  Eber Hong D [[id number]]  . Phenol-Glycerin 1.5-33 % LIQD    Sig: Use as directed 1 spray in the mouth or throat every 2 (two) hours as needed (sore throat).    Dispense:  30 mL    Refill:   1    Order Specific Question:  Supervising Provider    Answer:  Eber Hong D [[id number]]  . guaiFENesin (MUCINEX) 600 MG 12 hr tablet    Sig: Take 1 tablet (600 mg total) by mouth 2 (two) times daily as needed for cough or to loosen phlegm.    Dispense:  10 tablet    Refill:  0    Order Specific Question:  Supervising Provider    Answer:  Vida Roller [address] [place], PA-C 02/20/14 1013  Toy Cookey, MD 02/21/14 1746

## 2014-02-20 NOTE — ED Notes (Signed)
C/o cold symptoms x 2 days - nasal drainage - yellow/green. Nonprod cough noted. States has prod cough intermittently - yellow/green.

## 2014-02-20 NOTE — Discharge Instructions (Signed)
Continue to stay well-hydrated. Gargle warm salt water and spit it out. Continue to alternate between Tylenol and Ibuprofen for pain or fever. Use Mucinex for cough suppression/expectoration of mucus. Use netipot and flonase to help with nasal congestion. May consider over-the-counter Benadryl or other antihistamine to decrease secretions and for watery itchy eyes. Use chloraseptic spray for additional relief. Get plenty of rest. Followup with your primary care doctor in 5-7 days for recheck of ongoing symptoms. Return to emergency department for emergent changing or worsening of symptoms.   Cough, Adult  A cough is a reflex that helps clear your throat and airways. It can help heal the body or may be a reaction to an irritated airway. A cough may only last 2 or 3 weeks (acute) or may last more than 8 weeks (chronic).  CAUSES Acute cough:  Viral or bacterial infections. Chronic cough:  Infections.  Allergies.  Asthma.  Post-nasal drip.  Smoking.  Heartburn or acid reflux.  Some medicines.  Chronic lung problems (COPD).  Cancer. SYMPTOMS   Cough.  Fever.  Chest pain.  Increased breathing rate.  High-pitched whistling sound when breathing (wheezing).  Colored mucus that you cough up (sputum). TREATMENT   A bacterial cough may be treated with antibiotic medicine.  A viral cough must run its course and will not respond to antibiotics.  Your caregiver may recommend other treatments if you have a chronic cough. HOME CARE INSTRUCTIONS   Only take over-the-counter or prescription medicines for pain, discomfort, or fever as directed by your caregiver. Use cough suppressants only as directed by your caregiver.  Use a cold steam vaporizer or humidifier in your bedroom or home to help loosen secretions.  Sleep in a semi-upright position if your cough is worse at night.  Rest as needed.  Stop smoking if you smoke. SEEK IMMEDIATE MEDICAL CARE IF:   You have pus in your  sputum.  Your cough starts to worsen.  You cannot control your cough with suppressants and are losing sleep.  You begin coughing up blood.  You have difficulty breathing.  You develop pain which is getting worse or is uncontrolled with medicine.  You have a fever. MAKE SURE YOU:   Understand these instructions.  Will watch your condition.  Will get help right away if you are not doing well or get worse. Document Released: 06/30/2010 Document Revised: 03/26/2011 Document Reviewed: 06/30/2010 [facility] Patient Information 2015 [place], [place]. This information is not intended to replace advice given to you by your health care provider. Make sure you discuss any questions you have with your health care provider.  Upper Respiratory Infection, Adult An upper respiratory infection (URI) is also sometimes known as the common cold. The upper respiratory tract includes the nose, sinuses, throat, trachea, and bronchi. Bronchi are the airways leading to the lungs. Most people improve within 1 week, but symptoms can last up to 2 weeks. A residual cough may last even longer.  CAUSES Many different viruses can infect the tissues lining the upper respiratory tract. The tissues become irritated and inflamed and often become very moist. Mucus production is also common. A cold is contagious. You can easily spread the virus to others by oral contact. This includes kissing, sharing a glass, coughing, or sneezing. Touching your mouth or nose and then touching a surface, which is then touched by another person, can also spread the virus. SYMPTOMS  Symptoms typically develop 1 to 3 days after you come in contact with a cold virus. Symptoms  vary from person to person. They may include:  Runny nose.  Sneezing.  Nasal congestion.  Sinus irritation.  Sore throat.  Loss of voice (laryngitis).  Cough.  Fatigue.  Muscle aches.  Loss of appetite.  Headache.  Low-grade fever. DIAGNOSIS  You  might diagnose your own cold based on familiar symptoms, since most people get a cold 2 to 3 times a year. Your caregiver can confirm this based on your exam. Most importantly, your caregiver can check that your symptoms are not due to another disease such as strep throat, sinusitis, pneumonia, asthma, or epiglottitis. Blood tests, throat tests, and X-rays are not necessary to diagnose a common cold, but they may sometimes be helpful in excluding other more serious diseases. Your caregiver will decide if any further tests are required. RISKS AND COMPLICATIONS  You may be at risk for a more severe case of the common cold if you smoke cigarettes, have chronic heart disease (such as heart failure) or lung disease (such as asthma), or if you have a weakened immune system. The very young and very old are also at risk for more serious infections. Bacterial sinusitis, middle ear infections, and bacterial pneumonia can complicate the common cold. The common cold can worsen asthma and chronic obstructive pulmonary disease (COPD). Sometimes, these complications can require emergency medical care and may be life-threatening. PREVENTION  The best way to protect against getting a cold is to practice good hygiene. Avoid oral or hand contact with people with cold symptoms. Wash your hands often if contact occurs. There is no clear evidence that vitamin C, vitamin E, echinacea, or exercise reduces the chance of developing a cold. However, it is always recommended to get plenty of rest and practice good nutrition. TREATMENT  Treatment is directed at relieving symptoms. There is no cure. Antibiotics are not effective, because the infection is caused by a virus, not by bacteria. Treatment may include:  Increased fluid intake. Sports drinks offer valuable electrolytes, sugars, and fluids.  Breathing heated mist or steam (vaporizer or shower).  Eating chicken soup or other clear broths, and maintaining good  nutrition.  Getting plenty of rest.  Using gargles or lozenges for comfort.  Controlling fevers with ibuprofen or acetaminophen as directed by your caregiver.  Increasing usage of your inhaler if you have asthma. Zinc gel and zinc lozenges, taken in the first 24 hours of the common cold, can shorten the duration and lessen the severity of symptoms. Pain medicines may help with fever, muscle aches, and throat pain. A variety of non-prescription medicines are available to treat congestion and runny nose. Your caregiver can make recommendations and may suggest nasal or lung inhalers for other symptoms.  HOME CARE INSTRUCTIONS   Only take over-the-counter or prescription medicines for pain, discomfort, or fever as directed by your caregiver.  Use a warm mist humidifier or inhale steam from a shower to increase air moisture. This may keep secretions moist and make it easier to breathe.  Drink enough water and fluids to keep your urine clear or pale yellow.  Rest as needed.  Return to work when your temperature has returned to normal or as your caregiver advises. You may need to stay home longer to avoid infecting others. You can also use a face mask and careful hand washing to prevent spread of the virus. SEEK MEDICAL CARE IF:   After the first few days, you feel you are getting worse rather than better.  You need your caregiver's advice about medicines  to control symptoms.  You develop chills, worsening shortness of breath, or brown or red sputum. These may be signs of pneumonia.  You develop yellow or brown nasal discharge or pain in the face, especially when you bend forward. These may be signs of sinusitis.  You develop a fever, swollen neck glands, pain with swallowing, or white areas in the back of your throat. These may be signs of strep throat. SEEK IMMEDIATE MEDICAL CARE IF:   You have a fever.  You develop severe or persistent headache, ear pain, sinus pain, or chest  pain.  You develop wheezing, a prolonged cough, cough up blood, or have a change in your usual mucus (if you have chronic lung disease).  You develop sore muscles or a stiff neck. Document Released: 06/27/2000 Document Revised: 03/26/2011 Document Reviewed: 04/08/2013 [facility] Patient Information 2015 [place], [place]. This information is not intended to replace advice given to you by your health care provider. Make sure you discuss any questions you have with your health care provider.  Viral Infections A viral infection can be caused by different types of viruses.Most viral infections are not serious and resolve on their own. However, some infections may cause severe symptoms and may lead to further complications. SYMPTOMS Viruses can frequently cause:  Minor sore throat.  Aches and pains.  Headaches.  Runny nose.  Different types of rashes.  Watery eyes.  Tiredness.  Cough.  Loss of appetite.  Gastrointestinal infections, resulting in nausea, vomiting, and diarrhea. These symptoms do not respond to antibiotics because the infection is not caused by bacteria. However, you might catch a bacterial infection following the viral infection. This is sometimes called a "superinfection." Symptoms of such a bacterial infection may include:  Worsening sore throat with pus and difficulty swallowing.  Swollen neck glands.  Chills and a high or persistent fever.  Severe headache.  Tenderness over the sinuses.  Persistent overall ill feeling (malaise), muscle aches, and tiredness (fatigue).  Persistent cough.  Yellow, green, or brown mucus production with coughing. HOME CARE INSTRUCTIONS   Only take over-the-counter or prescription medicines for pain, discomfort, diarrhea, or fever as directed by your caregiver.  Drink enough water and fluids to keep your urine clear or pale yellow. Sports drinks can provide valuable electrolytes, sugars, and hydration.  Get plenty of rest  and maintain proper nutrition. Soups and broths with crackers or rice are fine. SEEK IMMEDIATE MEDICAL CARE IF:   You have severe headaches, shortness of breath, chest pain, neck pain, or an unusual rash.  You have uncontrolled vomiting, diarrhea, or you are unable to keep down fluids.  You or your child has an oral temperature above 102 F (38.9 C), not controlled by medicine.  Your baby is older than 3 months with a rectal temperature of 102 F (38.9 C) or higher.  Your baby is 56 months old or younger with a rectal temperature of 100.4 F (38 C) or higher. MAKE SURE YOU:   Understand these instructions.  Will watch your condition.  Will get help right away if you are not doing well or get worse. Document Released: 10/11/2004 Document Revised: 03/26/2011 Document Reviewed: 05/08/2010 [facility] Patient Information 2015 [place], [place]. This information is not intended to replace advice given to you by your health care provider. Make sure you discuss any questions you have with your health care provider.  Cool Mist Vaporizers Vaporizers may help relieve the symptoms of a cough and cold. They add moisture to the air, which helps  mucus to become thinner and less sticky. This makes it easier to breathe and cough up secretions. Cool mist vaporizers do not cause serious burns like hot mist vaporizers, which may also be called steamers or humidifiers. Vaporizers have not been proven to help with colds. You should not use a vaporizer if you are allergic to mold. HOME CARE INSTRUCTIONS  Follow the package instructions for the vaporizer.  Do not use anything other than distilled water in the vaporizer.  Do not run the vaporizer all of the time. This can cause mold or bacteria to grow in the vaporizer.  Clean the vaporizer after each time it is used.  Clean and dry the vaporizer well before storing it.  Stop using the vaporizer if worsening respiratory symptoms develop. Document Released:  09/29/2003 Document Revised: 01/06/2013 Document Reviewed: 05/21/2012 [facility] Patient Information 2015 [place]. This information is not intended to replace advice given to you by your health care provider. Make sure you discuss any questions you have with your health care provider.

## 2014-02-22 LAB — CULTURE, GROUP A STREP

## 2014-09-09 ENCOUNTER — Emergency Department (HOSPITAL_COMMUNITY): Payer: Self-pay

## 2014-09-09 ENCOUNTER — Emergency Department (HOSPITAL_COMMUNITY)
Admission: EM | Admit: 2014-09-09 | Discharge: 2014-09-09 | Disposition: A | Discharge status: Home / Self Care | Payer: Self-pay | Attending: Emergency Medicine | Admitting: Emergency Medicine

## 2014-09-09 ENCOUNTER — Encounter (HOSPITAL_COMMUNITY): Payer: Self-pay | Admitting: Family Medicine

## 2014-09-09 DIAGNOSIS — R0602 Shortness of breath: Secondary | ICD-10-CM | POA: Insufficient documentation

## 2014-09-09 DIAGNOSIS — Y998 Other external cause status: Secondary | ICD-10-CM | POA: Insufficient documentation

## 2014-09-09 DIAGNOSIS — Y9389 Activity, other specified: Secondary | ICD-10-CM | POA: Insufficient documentation

## 2014-09-09 DIAGNOSIS — X30XXXA Exposure to excessive natural heat, initial encounter: Secondary | ICD-10-CM | POA: Insufficient documentation

## 2014-09-09 DIAGNOSIS — X58XXXA Exposure to other specified factors, initial encounter: Secondary | ICD-10-CM | POA: Insufficient documentation

## 2014-09-09 DIAGNOSIS — Z7951 Long term (current) use of inhaled steroids: Secondary | ICD-10-CM | POA: Insufficient documentation

## 2014-09-09 DIAGNOSIS — Z79899 Other long term (current) drug therapy: Secondary | ICD-10-CM | POA: Insufficient documentation

## 2014-09-09 DIAGNOSIS — Y9289 Other specified places as the place of occurrence of the external cause: Secondary | ICD-10-CM | POA: Insufficient documentation

## 2014-09-09 DIAGNOSIS — T679XXA Effect of heat and light, unspecified, initial encounter: Secondary | ICD-10-CM

## 2014-09-09 LAB — I-STAT CHEM 8, ED
BUN: 19 mg/dL (ref 6–20)
CALCIUM ION: 1.16 mmol/L (ref 1.12–1.23)
CHLORIDE: 103 mmol/L (ref 101–111)
CREATININE: 1.1 mg/dL (ref 0.61–1.24)
Glucose, Bld: 97 mg/dL (ref 65–99)
HCT: 51 % (ref 39.0–52.0)
Hemoglobin: 17.3 g/dL — ABNORMAL HIGH (ref 13.0–17.0)
Potassium: 4.4 mmol/L (ref 3.5–5.1)
SODIUM: 138 mmol/L (ref 135–145)
TCO2: 27 mmol/L (ref 0–100)

## 2014-09-09 LAB — I-STAT TROPONIN, ED: Troponin i, poc: 0 ng/mL (ref 0.00–0.08)

## 2014-09-09 NOTE — ED Provider Notes (Signed)
CSN: [id number]     Arrival date & time 09/09/14  0932 History   First MD Initiated Contact with Patient 09/09/14 1006     Chief Complaint  Patient presents with  . Shortness of Breath     (Consider location/radiation/quality/duration/timing/severity/associated sxs/prior Treatment) HPI Comments: 24 year old male presenting with shortness of breath beginning while he was at work this morning. 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 This subsided once he was able to lay down. Before going to work he had a mild generalized headache. Reports similar symptoms when he had pneumonia in the past. Denies cough, wheezing, fever or chills. Denies chest pain. Has not had anything to eat today and rarely drinks water during his work day.  Patient is a 24 y.o. male presenting with shortness of breath. The history is provided by the patient.  Shortness of Breath   History reviewed. No pertinent past medical history. History reviewed. No pertinent past surgical history. History reviewed. No pertinent family history. Social History  Substance Use Topics  . Smoking status: Never Smoker   . Smokeless tobacco: Never Used  . Alcohol Use: Yes    Review of Systems  Respiratory: Positive for shortness of breath.   All other systems reviewed and are negative.     Allergies  Review of patient's allergies indicates no known allergies.  Home Medications   Prior to Admission medications   Medication Sig Start Date End Date Taking? Authorizing Provider  albuterol (PROVENTIL HFA;VENTOLIN HFA) 108 (90 BASE) MCG/ACT inhaler Inhale 1-2 puffs into the lungs every 6 (six) hours as needed for wheezing or shortness of breath. 01/10/13   Trixie Dredge, PA-C  azithromycin (ZITHROMAX) 250 MG tablet Take 1 tablet (250 mg total) by mouth daily. Take first 2 tablets together, then 1 every day until finished. Patient  not taking: Reported on 02/20/2014 03/09/13   Junius Finner, PA-C  fluticasone [facility]) 50 MCG/ACT nasal spray Place 2 sprays into both nostrils daily. 02/20/14   Mercedes Camprubi-Soms, PA-C  guaiFENesin (MUCINEX) 600 MG 12 hr tablet Take 1 tablet (600 mg total) by mouth 2 (two) times daily as needed for cough or to loosen phlegm. 02/20/14   Mercedes Camprubi-Soms, PA-C  Pheniramine-PE-APAP (THERAFLU FLU & SORE THROAT) 20-10-650 MG PACK Take 1 packet by mouth every 6 (six) hours as needed (Flu/cold symptoms).    Historical Provider, MD  Phenol-Glycerin 1.5-33 % LIQD Use as directed 1 spray in the mouth or throat every 2 (two) hours as needed (sore throat). 02/20/14   Mercedes Camprubi-Soms, PA-C  Pseudoeph-Doxylamine-DM-APAP (NYQUIL PO) Take 30 mLs by mouth once.    Historical Provider, MD   BP 127/75 mmHg  Pulse 61  Temp(Src) 98.4 F (36.9 C) (Oral)  Resp 15  Ht 6' (1.829 m)  Wt 230 lb (104.327 kg)  BMI 31.19 kg/m2  SpO2 98% Physical Exam  Constitutional: He is oriented to person, place, and time. He appears well-developed and well-nourished. No distress.  HENT:  Head: Normocephalic and atraumatic.  Mouth/Throat: Oropharynx is clear and moist.  Eyes: Conjunctivae and EOM are normal. Pupils are equal, round, and reactive to light.  Neck: Normal range of motion. Neck supple. No JVD present.  Cardiovascular: Normal rate, regular rhythm, normal heart sounds and intact distal pulses.   No extremity edema.  Pulmonary/Chest: Effort normal and breath sounds normal. No respiratory distress.  Abdominal: Soft. Bowel  sounds are normal. There is no tenderness.  Musculoskeletal: Normal range of motion. He exhibits no edema.  Neurological: He is alert and oriented to person, place, and time. He has normal strength. No sensory deficit.  Speech fluent, goal oriented. Moves extremities without ataxia. Equal grip strength bilateral.  Skin: Skin is warm and dry. He is not diaphoretic.  Psychiatric: He has a  normal mood and affect. His behavior is normal.  Nursing note and vitals reviewed.   ED Course  Procedures (including critical care time) Labs Review Labs Reviewed  I-STAT CHEM 8, ED - Abnormal; Notable for the following:    Hemoglobin 17.3 (*)    All other components within normal limits    Imaging Review Dg Chest 2 View  09/09/2014   CLINICAL DATA:  Acute chest pain and shortness of breath today.  EXAM: CHEST  2 VIEW  COMPARISON:  03/09/2013 and prior chest radiographs  FINDINGS: The cardiomediastinal silhouette is unremarkable.  There is no evidence of focal airspace disease, pulmonary edema, suspicious pulmonary nodule/mass, pleural effusion, or pneumothorax. No acute bony abnormalities are identified.  IMPRESSION: No active cardiopulmonary disease.   Electronically Signed   By: Harmon Pier M.D.   On: 09/09/2014 10:22   I have personally reviewed and evaluated these images and lab results as part of my medical decision-making.   EKG Interpretation None      MDM   Final diagnoses:  Shortness of breath  Heat exposure, initial encounter   NAD. AFVSS. CXR negative. Chem 8 WNL. Doubt cardiac. Doubt PE. PERC negative. Most likely from lack of eating and drinking prior to working in the heat. Asymptomatic in ED. Advised pt to eat/drink/stay hydrated in heat. Resources given for f/u. Stable for d/c. Return precautions given. Patient states understanding of treatment care plan and is agreeable.  Kathrynn Speed, PA-C 09/09/14 1053  Donnetta Hutching, MD 09/09/14 [phone]

## 2014-09-09 NOTE — ED Notes (Signed)
Patient transported to X-ray 

## 2014-09-09 NOTE — ED Notes (Signed)
Pt is in stable condition upon d/c and is escorted from ED via wheelchair. 

## 2014-09-09 NOTE — Discharge Instructions (Signed)
It is important to stay well hydrated especially when going to work in the heat.  Shortness of Breath Shortness of breath means you have trouble breathing. It could also mean that you have a medical problem. You should get immediate medical care for shortness of breath. CAUSES   Not enough oxygen in the air such as with high altitudes or a smoke-filled room.  Certain lung diseases, infections, or problems.  Heart disease or conditions, such as angina or heart failure.  Low red blood cells (anemia).  Poor physical fitness, which can cause shortness of breath when you exercise.  Chest or back injuries or stiffness.  Being overweight.  Smoking.  Anxiety, which can make you feel like you are not getting enough air. DIAGNOSIS  Serious medical problems can often be found during your physical exam. Tests may also be done to determine why you are having shortness of breath. Tests may include:  Chest X-rays.  Lung function tests.  Blood tests.  An electrocardiogram (ECG).  An ambulatory electrocardiogram. An ambulatory ECG records your heartbeat patterns over a 24-hour period.  Exercise testing.  A transthoracic echocardiogram (TTE). During echocardiography, sound waves are used to evaluate how blood flows through your heart.  A transesophageal echocardiogram (TEE).  Imaging scans. Your health care provider may not be able to find a cause for your shortness of breath after your exam. In this case, it is important to have a follow-up exam with your health care provider as directed.  TREATMENT  Treatment for shortness of breath depends on the cause of your symptoms and can vary greatly. HOME CARE INSTRUCTIONS   Do not smoke. Smoking is a common cause of shortness of breath. If you smoke, ask for help to quit.  Avoid being around chemicals or things that may bother your breathing, such as paint fumes and dust.  Rest as needed. Slowly resume your usual activities.  If  medicines were prescribed, take them as directed for the full length of time directed. This includes oxygen and any inhaled medicines.  Keep all follow-up appointments as directed by your health care provider. SEEK MEDICAL CARE IF:   Your condition does not improve in the time expected.  You have a hard time doing your normal activities even with rest.  You have any new symptoms. SEEK IMMEDIATE MEDICAL CARE IF:   Your shortness of breath gets worse.  You feel light-headed, faint, or develop a cough not controlled with medicines.  You start coughing up blood.  You have pain with breathing.  You have chest pain or pain in your arms, shoulders, or abdomen.  You have a fever.  You are unable to walk up stairs or exercise the way you normally do. MAKE SURE YOU:  Understand these instructions.  Will watch your condition.  Will get help right away if you are not doing well or get worse. Document Released: 09/26/2000 Document Revised: 01/06/2013 Document Reviewed: 03/19/2011 [facility] Patient Information 2015 [place], [place]. This information is not intended to replace advice given to you by your health care provider. Make sure you discuss any questions you have with your health care provider.  Heat-Related Illness Heat-related illnesses occur when the body is unable to properly cool itself. The body normally cools itself by sweating. However, under some conditions sweating is not enough. In these cases, a person's body temperature rises rapidly. Very high body temperatures may damage the brain or other vital organs. Some examples of heat-related illnesses include:  Heat stroke. This occurs  when the body is unable to regulate its temperature. The body's temperature rises rapidly, the sweating mechanism fails, and the body is unable to cool down. Body temperature may rise to 106 F (41 C) or higher within 10 to 15 minutes. Heat stroke can cause death or permanent disability if emergency  treatment is not provided.  Heat exhaustion. This is a milder form of heat-related illness that can develop after several days of exposure to high temperatures and not enough fluids. It is the body's response to an excessive loss of the water and salt contained in sweat.  Heat cramps. These usually affect people who sweat a lot during heavy activity. This sweating drains the body's salt and moisture. The low salt level in the muscles causes painful cramps. Heat cramps may also be a symptom of heat exhaustion. Heat cramps usually occur in the abdomen, arms, or legs. Get medical attention for cramps if you have heart problems or are on a low-sodium diet. Those that are at greatest risk for heat-related illnesses include:   The elderly.  Infant and the very young.  People with mental illness and chronic diseases.  People who are overweight (obese).  Young and healthy people can even succumb to heat if they participate in strenuous physical activities during hot weather. CAUSES  Several factors affect the body's ability to cool itself during extremely hot weather. When the humidity is high, sweat will not evaporate as quickly. This prevents the body from releasing heat quickly. Other factors that can affect the body's ability to cool down include:   Age.  Obesity.  Fever.  Dehydration.  Heart disease.  Mental illness.  Poor circulation.  Sunburn.  Prescription drug use.  Alcohol use. SYMPTOMS  Heat stroke: Warning signs of heat stroke vary, but may include:  An extremely high body temperature (above 103F orally).  A fast, strong pulse.  Dizziness.  Confusion.  Red, hot, and dry skin.  No sweating.  Throbbing headache.  Feeling sick to your stomach (nauseous).  Unconsciousness. Heat exhaustion: Warning signs of heat exhaustion include:  Heavy sweating.  Tiredness.  Headache.  Paleness.  Weakness.  Feeling sick to your stomach (nauseous) or  vomiting.  Muscle cramps. Heat cramps  Muscle pains or spasms. TREATMENT  Heat stroke  Get into a cool environment. An indoor place that is air-conditioned may be best.  Take a cool shower or bath. Have someone around to make sure you are okay.  Take your temperature. Make sure it is going down. Heat exhaustion  Drink plenty of fluids. Do not drink liquids that contain caffeine, alcohol, or large amounts of sugar. These cause you to lose more body fluid. Also, avoid very cold drinks. They can cause stomach cramps.  Get into a cool environment. An indoor place that is air-conditioned may be best.  Take a cool shower or bath. Have someone around to make sure you are okay.  Put on lightweight clothing. Heat cramps  Stop whatever activity you were doing. Do not attempt to do that activity for at least 3 hours after the cramps have gone away.  Get into a cool environment. An indoor place that is air-conditioned may be best. HOME CARE INSTRUCTIONS  To protect your health when temperatures are extremely high, follow these tips:  During heavy exercise in a hot environment, drink two to four glasses (16-32 ounces) of cool fluids each hour. Do not wait until you are thirsty to drink. Warning: If your caregiver limits the amount of  fluid you drink or has you on water pills, ask how much you should drink while the weather is hot.  Do not drink liquids that contain caffeine, alcohol, or large amounts of sugar. These cause you to lose more body fluid.  Avoid very cold drinks. They can cause stomach cramps.  Wear appropriate clothing. Choose lightweight, light-colored, loose-fitting clothing.  If you must be outdoors, try to limit your outdoor activity to morning and evening hours. Try to rest often in shady areas.  If you are not used to working or exercising in a hot environment, start slowly and pick up the pace gradually.  Stay cool in an air-conditioned place if possible. If your  home does not have air conditioning, go to the shopping mall or [employer].  Taking a cool shower or bath may help you cool off. SEEK MEDICAL CARE IF:   You see any of the symptoms listed above. You may be dealing with a life-threatening emergency.  Symptoms worsen or last longer than 1 hour.  Heat cramps do not get better in 1 hour. MAKE SURE YOU:   Understand these instructions.  Will watch your condition.  Will get help right away if you are not doing well or get worse. Document Released: 10/11/2007 Document Revised: 03/26/2011 Document Reviewed: 10/11/2007 [facility] Patient Information 2015 [place], [place]. This information is not intended to replace advice given to you by your health care provider. Make sure you discuss any questions you have with your health care provider.

## 2014-09-09 NOTE — ED Notes (Signed)
Pt here for SOB that started this am. sts that he was at work and he works in the heat inside a plant. Denies cough, fever. Denies recent travels

## 2014-09-09 NOTE — ED Notes (Signed)
Phlebotomy at bedside.

## 2014-11-12 ENCOUNTER — Ambulatory Visit
Admission: RE | Admit: 2014-11-12 | Discharge: 2014-11-12 | Disposition: A | Discharge status: Home / Self Care | Payer: No Typology Code available for payment source | Source: Ambulatory Visit | Attending: Physical Medicine and Rehabilitation | Admitting: Physical Medicine and Rehabilitation

## 2014-11-12 ENCOUNTER — Other Ambulatory Visit: Payer: Self-pay | Admitting: Physical Medicine and Rehabilitation

## 2014-11-12 DIAGNOSIS — Z0000 Encounter for general adult medical examination without abnormal findings: Secondary | ICD-10-CM

## 2015-10-05 ENCOUNTER — Ambulatory Visit (HOSPITAL_COMMUNITY)
Admission: EM | Admit: 2015-10-05 | Discharge: 2015-10-05 | Disposition: A | Discharge status: Home / Self Care | Payer: 59 | Attending: Family Medicine | Admitting: Family Medicine

## 2015-10-05 ENCOUNTER — Encounter (HOSPITAL_COMMUNITY): Payer: Self-pay | Admitting: Emergency Medicine

## 2015-10-05 DIAGNOSIS — J029 Acute pharyngitis, unspecified: Secondary | ICD-10-CM | POA: Diagnosis not present

## 2015-10-05 LAB — POCT RAPID STREP A: Streptococcus, Group A Screen (Direct): NEGATIVE

## 2015-10-05 NOTE — ED Triage Notes (Signed)
Pt states he has been suffering from a sore throat for two days and a mild headache yesterday.  Pt denies fever at home.  He has taken no OTC medications.

## 2015-10-05 NOTE — ED Provider Notes (Signed)
CSN: [id number]     Arrival date & time 10/05/15  1256 History   First MD Initiated Contact with Patient 10/05/15 1418     Chief Complaint  Patient presents with  . Sore Throat   (Consider location/radiation/quality/duration/timing/severity/associated sxs/prior Treatment) Son was just diagnosed with pos strep, is currently under treatment. Patient would like to be check for strep. Sore throat is currently 3/10.   The history is provided by the patient.  Sore Throat  This is a new problem. The current episode started more than 2 days ago. The problem occurs constantly. The problem has been gradually improving. Associated symptoms include headaches. Pertinent negatives include no chest pain, no abdominal pain and no shortness of breath. Nothing aggravates the symptoms. Nothing relieves the symptoms. He has tried nothing for the symptoms.    History reviewed. No pertinent past medical history. History reviewed. No pertinent surgical history. History reviewed. No pertinent family history. Social History  Substance Use Topics  . Smoking status: Never Smoker  . Smokeless tobacco: Never Used  . Alcohol use Yes    Review of Systems  Constitutional: Negative for chills, fatigue and fever.  HENT: Positive for sore throat. Negative for congestion, ear pain, rhinorrhea and sinus pressure.   Respiratory: Negative for cough and shortness of breath.   Cardiovascular: Negative for chest pain, palpitations and leg swelling.  Gastrointestinal: Negative for abdominal pain, diarrhea, nausea and vomiting.  Musculoskeletal: Negative for myalgias.  Neurological: Positive for headaches. Negative for dizziness and weakness.    Allergies  Review of patient's allergies indicates no known allergies.  Home Medications   Prior to Admission medications   Medication Sig Start Date End Date Taking? Authorizing Provider  albuterol (PROVENTIL HFA;VENTOLIN HFA) 108 (90 BASE) MCG/ACT inhaler Inhale 1-2 puffs  into the lungs every 6 (six) hours as needed for wheezing or shortness of breath. 01/10/13   Trixie DredgeEmily West, PA-C  azithromycin (ZITHROMAX) 250 MG tablet Take 1 tablet (250 mg total) by mouth daily. Take first 2 tablets together, then 1 every day until finished. Patient not taking: Reported on 02/20/2014 03/09/13   Junius FinnerErin O'Malley, PA-C  fluticasone [facility](FLONASE) 50 MCG/ACT nasal spray Place 2 sprays into both nostrils daily. 02/20/14   Mercedes Camprubi-Soms, PA-C  guaiFENesin (MUCINEX) 600 MG 12 hr tablet Take 1 tablet (600 mg total) by mouth 2 (two) times daily as needed for cough or to loosen phlegm. 02/20/14   Mercedes Camprubi-Soms, PA-C  Pheniramine-PE-APAP (THERAFLU FLU & SORE THROAT) 20-10-650 MG PACK Take 1 packet by mouth every 6 (six) hours as needed (Flu/cold symptoms).    Historical Provider, MD  Phenol-Glycerin 1.5-33 % LIQD Use as directed 1 spray in the mouth or throat every 2 (two) hours as needed (sore throat). 02/20/14   Mercedes Camprubi-Soms, PA-C  Pseudoeph-Doxylamine-DM-APAP (NYQUIL PO) Take 30 mLs by mouth once.    Historical Provider, MD   Meds Ordered and Administered this Visit  Medications - No data to display  BP 128/82 (BP Location: Left Arm)   Pulse 72   Temp 98.3 F (36.8 C) (Oral)   SpO2 98%  No data found.   Physical Exam  Constitutional: He is oriented to person, place, and time. He appears well-developed and well-nourished.  HENT:  Head: Normocephalic and atraumatic.  Right Ear: External ear normal.  Left Ear: External ear normal.  Nose: Nose normal.  Mouth/Throat: Oropharynx is clear and moist. No oropharyngeal exudate.  TM normal bilaterally  Eyes: Conjunctivae and EOM are normal. Pupils are equal,  round, and reactive to light.  Neck: Normal range of motion. Neck supple.  Cardiovascular: Normal rate, regular rhythm and normal heart sounds.   Pulmonary/Chest: Effort normal and breath sounds normal. No respiratory distress. He has no wheezes.  Abdominal: Soft. Bowel  sounds are normal. He exhibits no distension. There is no tenderness.  Musculoskeletal: Normal range of motion.  Lymphadenopathy:    He has no cervical adenopathy.  Neurological: He is alert and oriented to person, place, and time.  Skin: Skin is warm and dry.  Nursing note and vitals reviewed.   Urgent Care Course   Clinical Course    Procedures (including critical care time)  Labs Review Labs Reviewed  POCT RAPID STREP A    Imaging Review No results found.     MDM   1. Viral pharyngitis    Rapid strep negative. Treat symptomatically with salt water gargle, honey, throat lozenges. Return as needed. Follow up with PCP if sore throat does not improve. Patient denies any questions. Discharge instruction given.   Lucia Estelle, NP 10/05/15 1600

## 2015-10-08 LAB — CULTURE, GROUP A STREP (THRC)

## 2016-02-13 ENCOUNTER — Ambulatory Visit (HOSPITAL_COMMUNITY)
Admission: EM | Admit: 2016-02-13 | Discharge: 2016-02-13 | Disposition: A | Discharge status: Home / Self Care | Payer: 59 | Attending: Family Medicine | Admitting: Family Medicine

## 2016-02-13 ENCOUNTER — Encounter (HOSPITAL_COMMUNITY): Payer: Self-pay | Admitting: *Deleted

## 2016-02-13 DIAGNOSIS — A084 Viral intestinal infection, unspecified: Secondary | ICD-10-CM | POA: Diagnosis not present

## 2016-02-13 MED ORDER — ONDANSETRON 8 MG PO TBDP: 8.0000 mg | ORAL_TABLET | Freq: Three times a day (TID) | ORAL | 0 refills | Status: DC | PRN

## 2016-02-13 NOTE — ED Triage Notes (Signed)
PT   HAS   SYMPTOMS  OF     SYMPTOMS     OF  DIAARHEA      NAUSEA   FEVER      SINCE  LAST  PM   WITH A   COUGH   AS   WELL     PT   DID  NOT  GET A  FLU  SHOT  HE    IS       MASKED   AND  SITTING  UPRIGHT ON    THE  EXAM TABLE     IN NO  ACUTE   DISTRESS

## 2016-02-13 NOTE — ED Provider Notes (Signed)
[facility]    CSN: [id number] Arrival date & time: 02/13/16  [id number]     History   Chief Complaint Chief Complaint  Patient presents with  . URI    HPI Hortencia ConradiJeffery Schickling is a 26 y.o. male.   This a 26 year old gentleman who presents at [facility] with diarrhea. He's been around a number of family members that have the flu but nobody else has had the diarrhea. He works at [employer] [place] [employer]. Her graft patient's had no vomiting but he has had some nausea with this. There has been no blood in the stool. He has been able to keep down fluids all day.      History reviewed. No pertinent past medical history.  There are no active problems to display for this patient.   History reviewed. No pertinent surgical history.     Home Medications    Prior to Admission medications   Medication Sig Start Date End Date Taking? Authorizing Provider  albuterol (PROVENTIL HFA;VENTOLIN HFA) 108 (90 BASE) MCG/ACT inhaler Inhale 1-2 puffs into the lungs every 6 (six) hours as needed for wheezing or shortness of breath. 01/10/13   Trixie DredgeEmily West, PA-C  azithromycin (ZITHROMAX) 250 MG tablet Take 1 tablet (250 mg total) by mouth daily. Take first 2 tablets together, then 1 every day until finished. Patient not taking: Reported on 02/20/2014 03/09/13   Junius FinnerErin O'Malley, PA-C  fluticasone [facility](FLONASE) 50 MCG/ACT nasal spray Place 2 sprays into both nostrils daily. 02/20/14   Mercedes [address]pp [address], PA-C  guaiFENesin (MUCINEX) 600 MG 12 hr tablet Take 1 tablet (600 mg total) by mouth 2 (two) times daily as needed for cough or to loosen phlegm. 02/20/14   Mercedes [address], PA-C  ondansetron (ZOFRAN-ODT) 8 MG disintegrating tablet Take 1 tablet (8 mg total) by mouth every 8 (eight) hours as needed for nausea. 02/13/16   Elvina SidleKurt Jacobi Nile, MD  Pheniramine-PE-APAP (THERAFLU FLU & SORE THROAT) 20-10-650 MG PACK Take 1 packet by mouth every 6 (six) hours  as needed (Flu/cold symptoms).    Historical Provider, MD  Phenol-Glycerin 1.5-33 % LIQD Use as directed 1 spray in the mouth or throat every 2 (two) hours as needed (sore throat). 02/20/14   Mercedes [address]pp Street, PA-C  Pseudoeph-Doxylamine-DM-APAP (NYQUIL PO) Take 30 mLs by mouth once.    Historical Provider, MD    Family History History reviewed. No pertinent family history.  Social History Social History  Substance Use Topics  . Smoking status: Never Smoker  . Smokeless tobacco: Never Used  . Alcohol use Yes     Allergies   Patient has no known allergies.   Review of Systems Review of Systems  Constitutional: Positive for appetite change and fatigue.  HENT: Negative.   Respiratory: Negative.   Cardiovascular: Negative.   Gastrointestinal: Positive for diarrhea and nausea. Negative for abdominal pain and vomiting.  Musculoskeletal: Negative.   Neurological: Negative.      Physical Exam Triage Vital Signs ED Triage Vitals [02/13/16 1954]  Enc Vitals Group     BP 126/89     Pulse Rate 115     Resp      Temp 99.3 F (37.4 C)     Temp Source Oral     SpO2 100 %     Weight      Height      Head Circumference      Peak Flow  Pain Score      Pain Loc      Pain Edu?      Excl. in GC?    No data found.   Updated Vital Signs BP 126/89 (BP Location: Right Arm)   Pulse 115   Temp 99.3 F (37.4 C) (Oral)   SpO2 100%    Physical Exam  Constitutional: He is oriented to person, place, and time. He appears well-developed and well-nourished.  HENT:  Head: Normocephalic.  Right Ear: External ear normal.  Left Ear: External ear normal.  Nose: Nose normal.  Mouth/Throat: Oropharynx is clear and moist.  Eyes: Conjunctivae and EOM are normal. Pupils are equal, round, and reactive to light.  Neck: Normal range of motion. Neck supple.  Cardiovascular: Normal rate, regular rhythm and normal heart sounds.   Pulmonary/Chest: Effort normal and breath sounds normal.   Abdominal: Soft. Bowel sounds are normal. He exhibits no distension and no mass. There is no tenderness. There is no rebound and no guarding.  Musculoskeletal: Normal range of motion.  Neurological: He is alert and oriented to person, place, and time.  Skin: Skin is warm and dry.  Nursing note and vitals reviewed.    UC Treatments / Results  Labs (all labs ordered are listed, but only abnormal results are displayed) Labs Reviewed - No data to display  EKG  EKG Interpretation None       Radiology No results found.  Procedures Procedures (including critical care time)  Medications Ordered in UC Medications - No data to display   Initial Impression / Assessment and Plan / UC Course  I have reviewed the triage vital signs and the nursing notes.  Pertinent labs & imaging results that were available during my care of the patient were reviewed by me and considered in my medical decision making (see chart for details).     Final Clinical Impressions(s) / UC Diagnoses   Final diagnoses:  Viral gastroenteritis    New Prescriptions New Prescriptions   ONDANSETRON (ZOFRAN-ODT) 8 MG DISINTEGRATING TABLET    Take 1 tablet (8 mg total) by mouth every 8 (eight) hours as needed for nausea.     Elvina Sidle, MD 02/13/16 2004

## 2016-05-09 ENCOUNTER — Encounter (HOSPITAL_COMMUNITY): Payer: Self-pay | Admitting: Emergency Medicine

## 2016-05-09 ENCOUNTER — Ambulatory Visit (HOSPITAL_COMMUNITY)
Admission: EM | Admit: 2016-05-09 | Discharge: 2016-05-09 | Disposition: A | Discharge status: Home / Self Care | Payer: 59 | Attending: Family Medicine | Admitting: Family Medicine

## 2016-05-09 DIAGNOSIS — H1131 Conjunctival hemorrhage, right eye: Secondary | ICD-10-CM | POA: Diagnosis not present

## 2016-05-09 NOTE — Discharge Instructions (Signed)
You have a subconjunctival hemorrhage. This type of eye injury generally does not require any treatment. However if you have any changes in your symptoms such as changes in vision, blurred vision the sensation of flashing lights, the sensation of shades coming down over your eyes, any eye pain, or eye discharge, then go to the emergency room as soon as possible. The body will reabsorb the blood over process of 2-3 weeks and the whiteness of your eye will return to normal.

## 2016-05-09 NOTE — ED Triage Notes (Signed)
The patient presented to the [facility] with a complaint of a possible ruptured blood vessel in his right eye x 3 days. The patient denied ant pain.

## 2016-05-09 NOTE — ED Provider Notes (Signed)
CSN: [id number]     Arrival date & time 05/09/16  1828 History   First MD Initiated Contact with Patient 05/09/16 2011     Chief Complaint  Patient presents with  . Eye Problem   (Consider location/radiation/quality/duration/timing/severity/associated sxs/prior Treatment) 26 year old male presents to clinic for a subconjunctival hemorrhage that occurred 3 days ago. States he woke up from sleep and felt blood in his eye. He denies any pain, no blurred vision, no loss of vision, does not wear contacts does not wear glasses, no loss of peripheral vision, no sensation of halos, flashing lights, or of a shade coming down over his eyes. Denies any history of hypertension, diabetes, kidney disease, or any chronic conditions. He does not seek regular ophthalmologic care   The history is provided by the patient.  Eye Problem  Location:  Right eye Quality: no pain. Severity:  Moderate Onset quality:  Sudden Duration:  3 days Progression:  Unchanged Chronicity:  New Context comment:  Woke up from sleep Relieved by:  Nothing Ineffective treatments:  None tried Associated symptoms: redness   Associated symptoms: no blurred vision, no crusting, no decreased vision, no discharge, no double vision, no itching, no nausea, no numbness, no photophobia, no tearing and no vomiting   Risk factors: conjunctival hemorrhage     History reviewed. No pertinent past medical history. History reviewed. No pertinent surgical history. History reviewed. No pertinent family history. Social History  Substance Use Topics  . Smoking status: Never Smoker  . Smokeless tobacco: Never Used  . Alcohol use Yes    Review of Systems  Constitutional: Negative for chills and fever.  HENT: Negative for congestion, sinus pain, sinus pressure and sore throat.   Eyes: Positive for redness. Negative for blurred vision, double vision, photophobia, pain, discharge, itching and visual disturbance.  Respiratory: Negative for  shortness of breath and wheezing.   Cardiovascular: Negative for chest pain and palpitations.  Gastrointestinal: Negative for diarrhea, nausea and vomiting.  Genitourinary: Negative for dysuria and frequency.  Musculoskeletal: Negative for neck pain and neck stiffness.  Skin: Negative for rash and wound.  Neurological: Negative for dizziness, light-headedness and numbness.    Allergies  Patient has no known allergies.  Home Medications   Prior to Admission medications   Not on File   Meds Ordered and Administered this Visit  Medications - No data to display  BP (!) 139/99 (BP Location: Right Arm)   Pulse (!) 110   Temp 98.5 F (36.9 C) (Oral)   Resp 20   SpO2 95%  No data found.   Physical Exam  Constitutional: He is oriented to person, place, and time. He appears well-developed and well-nourished. No distress.  HENT:  Head: Normocephalic and atraumatic.  Right Ear: External ear normal.  Left Ear: External ear normal.  Eyes: EOM and lids are normal. Pupils are equal, round, and reactive to light. Right eye exhibits no discharge. Left eye exhibits no discharge. Right conjunctiva has a hemorrhage. Left conjunctiva is not injected. Left conjunctiva has no hemorrhage.  Fundoscopic exam:      The right eye shows no AV nicking, no exudate and no hemorrhage. The right eye shows red reflex.  Neck: Normal range of motion.  Neurological: He is alert and oriented to person, place, and time.  Skin: Skin is warm and dry. Capillary refill takes less than 2 seconds. He is not diaphoretic.  Psychiatric: He has a normal mood and affect. His behavior is normal.  Nursing note and vitals  reviewed.   Urgent Care Course     Procedures (including critical care time)  Labs Review Labs Reviewed - No data to display  Imaging Review No results found.     MDM   1. Subconjunctival hemorrhage of right eye      Provided counseling on subsequent conjunctival hemorrhages. Advised the  symptoms were most likely resolve on their own in 2-3 weeks. Advised to go to the emergency room immediately if there are any changes in his vision, eye pain, or other symptoms involving the eye.    Dorena Bodo, NP 05/09/16 2024

## 2017-01-07 ENCOUNTER — Emergency Department (HOSPITAL_COMMUNITY)
Admission: EM | Admit: 2017-01-07 | Discharge: 2017-01-07 | Disposition: A | Discharge status: Home / Self Care | Payer: 59 | Attending: Emergency Medicine | Admitting: Emergency Medicine

## 2017-01-07 ENCOUNTER — Other Ambulatory Visit: Payer: Self-pay

## 2017-01-07 ENCOUNTER — Encounter (HOSPITAL_COMMUNITY): Payer: Self-pay | Admitting: Nurse Practitioner

## 2017-01-07 DIAGNOSIS — J029 Acute pharyngitis, unspecified: Secondary | ICD-10-CM

## 2017-01-07 DIAGNOSIS — R0981 Nasal congestion: Secondary | ICD-10-CM | POA: Diagnosis not present

## 2017-01-07 LAB — RAPID STREP SCREEN (MED CTR MEBANE ONLY): Streptococcus, Group A Screen (Direct): NEGATIVE

## 2017-01-07 MED ORDER — FLUTICASONE PROPIONATE 50 MCG/ACT NA SUSP: 2.0000 | Freq: Every day | NASAL | 0 refills | Status: DC

## 2017-01-07 MED ORDER — PENICILLIN G BENZATHINE 1200000 UNIT/2ML IM SUSP
1.2000 10*6.[IU] | Freq: Once | INTRAMUSCULAR | Status: AC
  Administered 2017-01-07: 1.2 10*6.[IU] via INTRAMUSCULAR
  Filled 2017-01-07: qty 2

## 2017-01-07 MED ORDER — DEXAMETHASONE SODIUM PHOSPHATE 10 MG/ML IJ SOLN
10.0000 mg | Freq: Once | INTRAMUSCULAR | Status: AC
  Administered 2017-01-07: 10 mg via INTRAMUSCULAR
  Filled 2017-01-07: qty 1

## 2017-01-07 NOTE — ED Notes (Signed)
See EDP assessment 

## 2017-01-07 NOTE — ED Provider Notes (Signed)
[facility] EMERGENCY [facility]MENT Provider Note   CSN: [id number] Arrival date & time: 01/07/17  2016     History   Chief Complaint Chief Complaint  Patient presents with  . Sore Throat    HPI Tyler Howard is a 26 y.o. male presents today with chief complaint acute onset, progressively worsening sore throat for 3-4 days.  He endorses subjective fevers and chills.  He also notes nasal congestion and rare nonproductive cough.  He has tried over-the-counter medications including NyQuil without significant relief of his symptoms.  He denies drooling, facial swelling, or difficulty tolerating p.o. food and fluids.  He denies shortness of breath or chest pain.  No neck pain or neck stiffness.  He notes his son was diagnosed with strep throat and is being treated with antibiotics, and he notes his symptoms are very similar to his son's.  The history is provided by the patient.    History reviewed. No pertinent past medical history.  There are no active problems to display for this patient.   History reviewed. No pertinent surgical history.     Home Medications    Prior to Admission medications   Medication Sig Start Date End Date Taking? Authorizing Provider  fluticasone (FLONASE) 50 MCG/ACT nasal spray Place 2 sprays into both nostrils daily. 01/07/17   Jeanie SewerFawze, Mykalah Saari A, PA-C    Family History No family history on file.  Social History Social History   Tobacco Use  . Smoking status: Never Smoker  . Smokeless tobacco: Never Used  Substance Use Topics  . Alcohol use: Yes  . Drug use: No     Allergies   Patient has no known allergies.   Review of Systems Review of Systems  Constitutional: Positive for chills and fever.  HENT: Positive for congestion and sore throat.   Respiratory: Negative for shortness of breath.   Cardiovascular: Negative for chest pain.  Gastrointestinal: Negative for abdominal pain, nausea and vomiting.  Musculoskeletal:  Negative for neck pain and neck stiffness.     Physical Exam Updated Vital Signs BP (!) 137/97   Pulse (!) 123   Temp 99.2 F (37.3 C) (Oral)   Resp 18   Physical Exam  Constitutional: He appears well-developed and well-nourished. No distress.  HENT:  Head: Normocephalic and atraumatic.  Right Ear: Tympanic membrane and ear canal normal.  Left Ear: Tympanic membrane and ear canal normal.  Mouth/Throat: Uvula is midline and oropharynx is clear and moist.  No frontal or maxillary sinus TTP.  Nasal septum is midline with mucosal edema bilaterally.  Posterior oropharynx with significantly hypertrophied tonsils with white patchy exudates and erythema.  No uvular deviation, trismus, or sublingual abnormalities.  No facial swelling noted.  Eyes: Conjunctivae and EOM are normal. Pupils are equal, round, and reactive to light. Right eye exhibits no discharge. Left eye exhibits no discharge.  Neck: Normal range of motion. Neck supple. No JVD present. No tracheal deviation present.  Bilateral anterior cervical LAD  Cardiovascular: Regular rhythm and normal heart sounds.  Tachycardic  Pulmonary/Chest: Effort normal and breath sounds normal. No stridor. No respiratory distress. He has no wheezes. He has no rhonchi. He has no rales. He exhibits no tenderness.  Abdominal: Soft. Bowel sounds are normal. He exhibits no distension. There is no tenderness.  Musculoskeletal: He exhibits no edema.  Lymphadenopathy:    He has cervical adenopathy.  Neurological: He is alert.  Skin: Skin is warm and dry. No erythema.  Psychiatric: He has a normal  mood and affect. His behavior is normal.  Nursing note and vitals reviewed.    ED Treatments / Results  Labs (all labs ordered are listed, but only abnormal results are displayed) Labs Reviewed  RAPID STREP SCREEN (NOT AT [facility])  CULTURE, GROUP A STREP [facility])    EKG  EKG Interpretation None       Radiology No results  found.  Procedures Procedures (including critical care time)  Medications Ordered in ED Medications  penicillin g benzathine (BICILLIN LA) 1200000 UNIT/2ML injection 1.2 Million Units (not administered)  dexamethasone (DECADRON) injection 10 mg (not administered)     Initial Impression / Assessment and Plan / ED Course  I have reviewed the triage vital signs and the nursing notes.  Pertinent labs & imaging results that were available during my care of the patient were reviewed by me and considered in my medical decision making (see chart for details).     Pt with low grade fever with tonsillar exudate, cervical lymphadenopathy, & dysphagia.  He is tachycardic however this appears to be his baseline based on chart review and he does have a low-grade fever.  Rapid strep is negative, however he has a known sick contact to someone who is currently being treated for strep throat.  Treated in the ED with steroids and PCN IM.  Pt appears mildly dehydrated, discussed importance of water rehydration. Presentation non concerning for PTA or infxn spread to soft tissue. No trismus or uvula deviation. Pt able to drink water in ED without difficulty with intact air way.  Discussed indications for return to the ED.  Recommend primary care follow-up for reevaluation of symptoms in the next 3-4 days. Pt verbalized understanding of and agreement with plan and is safe for discharge home at this time.  He has no complaints prior to discharge.  Final Clinical Impressions(s) / ED Diagnoses   Final diagnoses:  Sore throat  Nasal congestion    ED Discharge Orders        Ordered    fluticasone (FLONASE) 50 MCG/ACT nasal spray  Daily     01/07/17 2123       Jeanie SewerFawze, Briannon Boggio A, PA-C 01/07/17 2131    Charlynne PanderYao, David Hsienta, MD 01/10/17 [phone]

## 2017-01-07 NOTE — Discharge Instructions (Signed)
Drink plenty of fluids and get plenty of rest. Gargle warm salt water and spit it out for sore throat. May also use cough drops, warm teas, etc. Take flonase to decrease nasal congestion. Alternate 600 mg of ibuprofen and [phone] mg of Tylenol every 3 hours as needed for pain. Do not exceed 4000 mg of Tylenol daily.   Followup with your primary care doctor in 5-7 days for recheck of ongoing symptoms. Return to emergency department for emergent changing or worsening of symptoms such as throat tightness, facial swelling, fever not controlled by ibuprofen or Tylenol,difficulty breathing, or chest pain.

## 2017-01-07 NOTE — ED Triage Notes (Signed)
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 Pt notes sore throat and dry cough and rhinorrhea 3-4 days ago sts has tried nyquill with no relief. Denies fever, chills, ear pain, generalized body aches.

## 2017-01-09 LAB — CULTURE, GROUP A STREP (THRC)

## 2017-01-10 ENCOUNTER — Telehealth: Payer: Self-pay | Admitting: *Deleted

## 2017-01-10 NOTE — Progress Notes (Signed)
ED Antimicrobial Stewardship Positive Culture Follow Up   Hortencia ConradiJeffery Mednick is an 26 y.o. male who [facility] with a chief complaint of  Chief Complaint  Patient presents with  . Sore Throat    Recent Results (from the past 720 hour(s))  Rapid strep screen     Status: None   Collection Time: 01/07/17  8:28 PM  Result Value Ref Range Status   Streptococcus, Group A Screen (Direct) NEGATIVE NEGATIVE Final    Comment: (NOTE) A Rapid Antigen test may result negative if the antigen level in the sample is below the detection level of this test. The FDA has not cleared this test as a stand-alone test therefore the rapid antigen negative result has reflexed to a Group A Strep culture.   Culture, group A strep     Status: None   Collection Time: 01/07/17  8:28 PM  Result Value Ref Range Status   Specimen Description THROAT  Final   Special Requests NONE Reflexed from [id number]  Final   Culture FEW GROUP A STREP (S.PYOGENES) ISOLATED  Final   Report Status 01/09/2017 FINAL  Final     [x]  Patient discharged originally without antimicrobial agent and treatment is now indicated  New antibiotic prescription: amoxicillin 500mg  po BID x 10 days   ED Provider: Dr. Lenise HeraldWentz   Latoyna Hird, Garth BignessJeremy John 01/10/2017, 9:17 AM Infectious Diseases Pharmacist Phone# [phone]

## 2017-01-10 NOTE — Telephone Encounter (Signed)
Post ED Visit - Positive Culture Follow-up: Successful Patient Follow-Up  Culture assessed and recommendations reviewed by: []  Enzo BiNathan Batchelder, Pharm.D. []  Celedonio MiyamotoJeremy Frens, Pharm.D., BCPS AQ-ID []  Garvin FilaMike Maccia, Pharm.D., BCPS []  Georgina PillionElizabeth Martin, Pharm.D., BCPS []  CarletonMinh Pham, VermontPharm.D., BCPS, AAHIVP []  Estella HuskMichelle Turner, Pharm.D., BCPS, AAHIVP []  Tyler Pearlachel Rumbarger, PharmD, BCPS []  Casilda Carlsaylor Stone, PharmD, BCPS []  Pollyann SamplesAndy Johnston, PharmD, BCPS  Positive strep culture  [x]  Patient discharged without antimicrobial prescription and treatment is now indicated []  Organism is resistant to prescribed ED discharge antimicrobial []  Patient with positive blood cultures  Changes discussed with ED provider, Dr. Gray BernhardtElliot Wentz New antibiotic prescription Amoxicillin 500mg  PO BID x 10 days Called to [facility]. [address], [phone]  Contacted patient, date 01/10/2017, time 0949   Tyler Howard, Tyler Howard 01/10/2017, 9:47 AM

## 2017-04-26 ENCOUNTER — Encounter (HOSPITAL_COMMUNITY): Payer: Self-pay | Admitting: Emergency Medicine

## 2017-04-26 ENCOUNTER — Ambulatory Visit (HOSPITAL_COMMUNITY)
Admission: EM | Admit: 2017-04-26 | Discharge: 2017-04-26 | Disposition: A | Discharge status: Home / Self Care | Payer: 59 | Attending: Family Medicine | Admitting: Family Medicine

## 2017-04-26 DIAGNOSIS — M5432 Sciatica, left side: Secondary | ICD-10-CM

## 2017-04-26 MED ORDER — PREDNISONE 50 MG PO TABS: ORAL_TABLET | ORAL | 0 refills | Status: DC

## 2017-04-26 NOTE — ED Provider Notes (Signed)
[facility]    CSN: [id number] Arrival date & time: 04/26/17  4098     History   Chief Complaint Chief Complaint  Patient presents with  . Leg Pain    HPI Tyler Howard is a 27 y.o. male.   27 yo male here for left sided low back pain that started 3 days ago. He now has left leg pain that shoots down his leg. Tried ibuprofen and helped a little. Walking makes pain worse. No known injury.      History reviewed. No pertinent past medical history.  There are no active problems to display for this patient.   History reviewed. No pertinent surgical history.     Home Medications    Prior to Admission medications   Medication Sig Start Date End Date Taking? Authorizing Provider  fluticasone (FLONASE) 50 MCG/ACT nasal spray Place 2 sprays into both nostrils daily. 01/07/17   Jeanie Sewer, PA-C    Family History No family history on file.  Social History Social History   Tobacco Use  . Smoking status: Never Smoker  . Smokeless tobacco: Never Used  Substance Use Topics  . Alcohol use: Yes  . Drug use: No     Allergies   Patient has no known allergies.   Review of Systems Review of Systems  Constitutional: Negative for activity change and appetite change.  HENT: Negative for congestion and ear pain.   Eyes: Negative for discharge and itching.  Respiratory: Negative for apnea and chest tightness.   Cardiovascular: Negative for chest pain and palpitations.  Gastrointestinal: Negative for abdominal distention and abdominal pain.  Endocrine: Negative for cold intolerance and heat intolerance.  Genitourinary: Negative for difficulty urinating and dysuria.  Musculoskeletal: Positive for back pain. Negative for arthralgias.  Neurological: Negative for dizziness and headaches.  Hematological: Negative for adenopathy. Does not bruise/bleed easily.     Physical Exam Triage Vital Signs ED Triage Vitals [04/26/17 1024]  Enc Vitals Group     BP (!)  144/87     Pulse Rate 95     Resp 16     Temp 98.6 F (37 C)     Temp src      SpO2 96 %     Weight      Height      Head Circumference      Peak Flow      Pain Score      Pain Loc      Pain Edu?      Excl. in GC?    No data found.  Updated Vital Signs BP (!) 144/87   Pulse 95   Temp 98.6 F (37 C)   Resp 16   SpO2 96%   Visual Acuity Right Eye Distance:   Left Eye Distance:   Bilateral Distance:    Right Eye Near:   Left Eye Near:    Bilateral Near:     Physical Exam  Constitutional: He is oriented to person, place, and time. He appears well-developed and well-nourished.  HENT:  Head: Normocephalic and atraumatic.  Eyes: EOM are normal.  Neck: Normal range of motion. Neck supple.  Pulmonary/Chest: Effort normal. No respiratory distress.  Musculoskeletal: Normal range of motion.  Positive left leg raise. No tenderness to palpation of lower back. Normal strength and sensation intact of bilateral lower extremities.  Neurological: He is oriented to person, place, and time.  Skin: Skin is warm.  Psychiatric: He has a normal mood and affect. His  behavior is normal.     UC Treatments / Results  Labs (all labs ordered are listed, but only abnormal results are displayed) Labs Reviewed - No data to display  EKG None Radiology No results found.  Procedures Procedures (including critical care time)  Medications Ordered in UC Medications - No data to display   Initial Impression / Assessment and Plan / UC Course  I have reviewed the triage vital signs and the nursing notes.  Pertinent labs & imaging results that were available during my care of the patient were reviewed by me and considered in my medical decision making (see chart for details).     1. Sciatica: will treat with prednisone burst. Do not take ibuprofen while taking prednisone. Will refer for PT.  Final Clinical Impressions(s) / UC Diagnoses   Final diagnoses:  None    ED Discharge  Orders    None       Controlled Substance Prescriptions [place] Controlled Substance Registry consulted? Not Applicable   Rolm BookbinderMoss, Timothy Trudell, DO 04/26/17 1050

## 2017-04-26 NOTE — ED Triage Notes (Signed)
Pt c/o L leg pain, states it starts in his left back and travels down his left leg. x3 days. Denies injury

## 2017-05-22 ENCOUNTER — Ambulatory Visit (HOSPITAL_COMMUNITY)
Admission: EM | Admit: 2017-05-22 | Discharge: 2017-05-22 | Disposition: A | Discharge status: Home / Self Care | Payer: 59 | Attending: Family Medicine | Admitting: Family Medicine

## 2017-05-22 ENCOUNTER — Encounter (HOSPITAL_COMMUNITY): Payer: Self-pay

## 2017-05-22 DIAGNOSIS — M5432 Sciatica, left side: Secondary | ICD-10-CM

## 2017-05-22 MED ORDER — CYCLOBENZAPRINE HCL 5 MG PO TABS: 5.0000 mg | ORAL_TABLET | Freq: Every day | ORAL | 0 refills | Status: DC

## 2017-05-22 MED ORDER — NAPROXEN 500 MG PO TABS: 500.0000 mg | ORAL_TABLET | Freq: Two times a day (BID) | ORAL | 0 refills | Status: DC

## 2017-05-22 NOTE — Discharge Instructions (Signed)
Light and regular activity as tolerated.  Limit lifting to <25 lbs until symptoms improve.  Naproxen twice a day, take with food.  Flexeril at night for muscle spasms, can cause drowsiness. Please follow up with orthopedics and/or primary care as may need more long term management if your symptoms persist.

## 2017-05-22 NOTE — ED Provider Notes (Signed)
[facility]    CSN: [id number] Arrival date & time: 05/22/17  1901     History   Chief Complaint Chief Complaint  Patient presents with  . Leg Pain    HPI Tyler Howard is a 27 y.o. male.   Tyler Howard presents with complaints of left low back and left leg pain which has been ongoing for the past 2-3 weeks. Was seen here 4/12 and was provided with prednisone which mildly helped with symptoms. Symptoms have been more constant however. Throbbing to left leg even while at rest. It is worse with transitioning from sitting to laying and laying to standing. Pain with walking. No specific known injury but states had been doing yard work as well as lifts at work prior to symptoms starting. Without numbness or tingling to leg or foot. Denies saddle paresthesia. No urine or stool incontinence. Has been taking ibuprofen intermittently which minimally helps, took 1 tab approximately 1 hour ago. Pain radiates from left thigh to left shin. Does not have a PCP. Without contributing medical history.      ROS per HPI.       History reviewed. No pertinent past medical history.  There are no active problems to display for this patient.   History reviewed. No pertinent surgical history.     Home Medications    Prior to Admission medications   Medication Sig Start Date End Date Taking? Authorizing Provider  cyclobenzaprine (FLEXERIL) 5 MG tablet Take 1 tablet (5 mg total) by mouth at bedtime. 05/22/17   Georgetta Haber, NP  fluticasone (FLONASE) 50 MCG/ACT nasal spray Place 2 sprays into both nostrils daily. 01/07/17   Michela Pitcher A, PA-C  naproxen (NAPROSYN) 500 MG tablet Take 1 tablet (500 mg total) by mouth 2 (two) times daily. 05/22/17   Georgetta Haber, NP    Family History No family history on file.  Social History Social History   Tobacco Use  . Smoking status: Never Smoker  . Smokeless tobacco: Never Used  Substance Use Topics  . Alcohol use: Yes  . Drug use: No      Allergies   Patient has no known allergies.   Review of Systems Review of Systems   Physical Exam Triage Vital Signs ED Triage Vitals [05/22/17 1940]  Enc Vitals Group     BP      Pulse      Resp      Temp      Temp src      SpO2      Weight      Height      Head Circumference      Peak Flow      Pain Score 5     Pain Loc      Pain Edu?      Excl. in GC?    No data found.  Updated Vital Signs There were no vitals taken for this visit.  Visual Acuity Right Eye Distance:   Left Eye Distance:   Bilateral Distance:    Right Eye Near:   Left Eye Near:    Bilateral Near:     Physical Exam  Constitutional: He is oriented to person, place, and time. He appears well-developed and well-nourished.  Cardiovascular: Normal rate and regular rhythm.  Pulmonary/Chest: Effort normal and breath sounds normal.  Musculoskeletal:       Left hip: Normal.       Left knee: Normal.       Left lower  leg: Normal.       Legs: Indicates pain to left anterior shin, non tender on palpation, without redness or swelling; without pain with heel weight bearing, mild increased pain with toe touch weight bearing; mild weakness to left leg with weight bearing noted but able to stand on left leg without support; ambulatory; pain to left thigh and low back with straight leg raise,mild pain with left hip flexion to left low back; visible discomfort with transition from sit to lay and lay to sit; sensation intact   Neurological: He is alert and oriented to person, place, and time.  Skin: Skin is warm and dry.     UC Treatments / Results  Labs (all labs ordered are listed, but only abnormal results are displayed) Labs Reviewed - No data to display  EKG None  Radiology No results found.  Procedures Procedures (including critical care time)  Medications Ordered in UC Medications - No data to display  Initial Impression / Assessment and Plan / UC Course  I have reviewed the triage  vital signs and the nursing notes.  Pertinent labs & imaging results that were available during my care of the patient were reviewed by me and considered in my medical decision making (see chart for details).     History and physical remains consistent with sciatica. Without red flag findings at this time. Naproxen twice a day regularly as well as flexeril at night. Discussed body mechanics and core strengthening, exercises provided. Encouraged follow up with ortho and/or pcp for continued management. Patient verbalized understanding and agreeable to plan.     Final Clinical Impressions(s) / UC Diagnoses   Final diagnoses:  Sciatica of left side     Discharge Instructions     Light and regular activity as tolerated.  Limit lifting to <25 lbs until symptoms improve.  Naproxen twice a day, take with food.  Flexeril at night for muscle spasms, can cause drowsiness. Please follow up with orthopedics and/or primary care as may need more long term management if your symptoms persist.    ED Prescriptions    Medication Sig Dispense Auth. Provider   naproxen (NAPROSYN) 500 MG tablet Take 1 tablet (500 mg total) by mouth 2 (two) times daily. 30 tablet Linus Mako B, NP   cyclobenzaprine (FLEXERIL) 5 MG tablet Take 1 tablet (5 mg total) by mouth at bedtime. 15 tablet Georgetta Haber, NP     Controlled Substance Prescriptions [place] Controlled Substance Registry consulted? Not Applicable   Georgetta Haber, NP 05/22/17 2002

## 2017-05-22 NOTE — ED Triage Notes (Signed)
Pt complains of left leg pain, denies injury

## 2017-07-05 ENCOUNTER — Other Ambulatory Visit: Payer: Self-pay

## 2017-07-05 ENCOUNTER — Encounter (HOSPITAL_COMMUNITY): Payer: Self-pay

## 2017-07-05 ENCOUNTER — Ambulatory Visit (HOSPITAL_COMMUNITY)
Admission: EM | Admit: 2017-07-05 | Discharge: 2017-07-05 | Disposition: A | Discharge status: Home / Self Care | Payer: 59 | Attending: Family Medicine | Admitting: Family Medicine

## 2017-07-05 DIAGNOSIS — G8929 Other chronic pain: Secondary | ICD-10-CM | POA: Diagnosis not present

## 2017-07-05 DIAGNOSIS — M5442 Lumbago with sciatica, left side: Secondary | ICD-10-CM

## 2017-07-05 MED ORDER — NAPROXEN 500 MG PO TABS: 500.0000 mg | ORAL_TABLET | Freq: Two times a day (BID) | ORAL | 1 refills | Status: DC

## 2017-07-05 MED ORDER — CYCLOBENZAPRINE HCL 5 MG PO TABS: 5.0000 mg | ORAL_TABLET | Freq: Every day | ORAL | 1 refills | Status: DC

## 2017-07-05 NOTE — ED Provider Notes (Signed)
[facility]    CSN: [id number] Arrival date & time: 07/05/17  1029     History   Chief Complaint No chief complaint on file.   HPI Tyler Howard is a 27 y.o. male.   HPI  Patient with chronic recurring low back pain.  He has been seen through ER and urgent care center on multiple occasions.  He has low back pain that seems to radiate down his left leg.  It is worse with activity.  Better with rest.  No numbness or weakness.  No bowel or bladder complaints.  No accident or injury. Known back condition.  He has not gotten back x-rays. It is been recommended he have physical therapy.  This is not yet been accomplished. When he was last seen he was given Naprosyn 500 twice daily.  This is helped him the best.  He would like a refill this medication. We discussed if he continues to have recurring low back pain he needs to perhaps see a back specialist.  Initially, he needs to see a PCP.  I gave him some recommendations of local doctors accepting new patients.  History reviewed. No pertinent past medical history.  There are no active problems to display for this patient.   History reviewed. No pertinent surgical history.     Home Medications    Prior to Admission medications   Medication Sig Start Date End Date Taking? Authorizing Provider  cyclobenzaprine (FLEXERIL) 5 MG tablet Take 1 tablet (5 mg total) by mouth at bedtime. 07/05/17   Eustace Moore, MD  fluticasone [facility]) 50 MCG/ACT nasal spray Place 2 sprays into both nostrils daily. 01/07/17   Michela Pitcher A, PA-C  naproxen (NAPROSYN) 500 MG tablet Take 1 tablet (500 mg total) by mouth 2 (two) times daily. 07/05/17   Eustace Moore, MD    Family History Family History  Problem Relation Age of Onset  . Healthy Mother   . Healthy Father    No family history of back problems Social History Social History   Tobacco Use  . Smoking status: Never Smoker  . Smokeless tobacco: Never Used  Substance  Use Topics  . Alcohol use: Yes  . Drug use: No     Allergies   Patient has no known allergies.   Review of Systems Review of Systems  Constitutional: Negative for chills and fever.  HENT: Negative for ear pain and sore throat.   Eyes: Negative for pain and visual disturbance.  Respiratory: Negative for cough and shortness of breath.   Cardiovascular: Negative for chest pain and palpitations.  Gastrointestinal: Negative for abdominal pain and vomiting.  Genitourinary: Negative for dysuria and hematuria.  Musculoskeletal: Positive for back pain. Negative for arthralgias.  Skin: Negative for color change and rash.  Neurological: Negative for seizures and syncope.  All other systems reviewed and are negative.    Physical Exam Triage Vital Signs ED Triage Vitals  Enc Vitals Group     BP 07/05/17 1041 127/86     Pulse Rate 07/05/17 1041 (!) 108     Resp 07/05/17 1041 20     Temp 07/05/17 1041 98.5 F (36.9 C)     Temp Source 07/05/17 1041 Oral     SpO2 07/05/17 1041 95 %     Weight --      Height --      Head Circumference --      Peak Flow --      Pain Score 07/05/17 1052 4  Pain Loc --      Pain Edu? --      Excl. in GC? --    No data found.  Updated Vital Signs BP 127/86 (BP Location: Left Arm)   Pulse (!) 108   Temp 98.5 F (36.9 C) (Oral)   Resp 20   SpO2 95%   Visual Acuity Right Eye Distance:   Left Eye Distance:   Bilateral Distance:    Right Eye Near:   Left Eye Near:    Bilateral Near:     Physical Exam  Constitutional: He appears well-developed and well-nourished. No distress.  HENT:  Head: Normocephalic and atraumatic.  Mouth/Throat: Oropharynx is clear and moist.  Eyes: Pupils are equal, round, and reactive to light. Conjunctivae are normal.  Neck: Normal range of motion.  Cardiovascular: Normal rate, regular rhythm and normal heart sounds.  Pulmonary/Chest: Effort normal and breath sounds normal. No respiratory distress.  Abdominal:  Soft. He exhibits no distension.  Musculoskeletal: Normal range of motion. He exhibits no edema.  Lumbar spine is straight and symmetric. Full range of motion. No muscle spasm. Strength, sensation, range of motion, and reflexes are normal in both lower extremities. Straight leg raise is negative bilateral.  Mild tenderness in the left SI joint.   Neurological: He is alert.  Skin: Skin is warm and dry.     UC Treatments / Results  Labs (all labs ordered are listed, but only abnormal results are displayed) Labs Reviewed - No data to display  EKG None  Radiology No results found.  Procedures Procedures (including critical care time)  Medications Ordered in UC Medications - No data to display  Initial Impression / Assessment and Plan / UC Course  I have reviewed the triage vital signs and the nursing notes.  Pertinent labs & imaging results that were available during my care of the patient were reviewed by me and considered in my medical decision making (see chart for details).     Patient complains of pain in the left lateral leg at times with walking.  He does not have a straight leg raise or pain at this time. Final Clinical Impressions(s) / UC Diagnoses   Final diagnoses:  Chronic bilateral low back pain with left-sided sciatica     Discharge Instructions     Take Naprosyn 2 times a day with food  take Flexeril when you needed at night for Muscle spasm, rest Off work tonight I agree with your decision to follow-up with the PCP.  Return here as needed   ED Prescriptions    Medication Sig Dispense Auth. Provider   naproxen (NAPROSYN) 500 MG tablet Take 1 tablet (500 mg total) by mouth 2 (two) times daily. 60 tablet Eustace MooreNelson, Jamael Hoffmann Sue, MD   cyclobenzaprine (FLEXERIL) 5 MG tablet Take 1 tablet (5 mg total) by mouth at bedtime. 30 tablet Eustace MooreNelson, Beni Turrell Sue, MD     Controlled Substance Prescriptions [place] Controlled Substance Registry consulted? Not Applicable     Eustace MooreNelson, Svea Pusch Sue, MD 07/05/17 2132

## 2017-07-05 NOTE — Discharge Instructions (Addendum)
Take Naprosyn 2 times a day with food  take Flexeril when you needed at night for Muscle spasm, rest Off work tonight I agree with your decision to follow-up with the PCP.  Return here as needed

## 2017-07-05 NOTE — ED Triage Notes (Signed)
Pt presents with complaints of sciatic pain x 1 month that is getting worse with exertion

## 2017-07-23 ENCOUNTER — Encounter: Payer: Self-pay | Admitting: Family Medicine

## 2017-07-23 ENCOUNTER — Ambulatory Visit: Payer: 59 | Admitting: Family Medicine

## 2017-07-23 VITALS — BP 122/84 | HR 95 | Temp 98.4°F | Resp 12 | Ht 72.0 in | Wt 273.0 lb

## 2017-07-23 DIAGNOSIS — Z114 Encounter for screening for human immunodeficiency virus [HIV]: Secondary | ICD-10-CM | POA: Diagnosis not present

## 2017-07-23 DIAGNOSIS — M545 Low back pain, unspecified: Secondary | ICD-10-CM | POA: Insufficient documentation

## 2017-07-23 DIAGNOSIS — M5442 Lumbago with sciatica, left side: Secondary | ICD-10-CM | POA: Diagnosis not present

## 2017-07-23 DIAGNOSIS — I499 Cardiac arrhythmia, unspecified: Secondary | ICD-10-CM

## 2017-07-23 DIAGNOSIS — Z0000 Encounter for general adult medical examination without abnormal findings: Secondary | ICD-10-CM

## 2017-07-23 DIAGNOSIS — Z23 Encounter for immunization: Secondary | ICD-10-CM | POA: Diagnosis not present

## 2017-07-23 LAB — CBC
HCT: 47.3 % (ref 39.0–52.0)
Hemoglobin: 15.8 g/dL (ref 13.0–17.0)
MCHC: 33.4 g/dL (ref 30.0–36.0)
MCV: 83.3 fl (ref 78.0–100.0)
PLATELETS: 296 10*3/uL (ref 150.0–400.0)
RBC: 5.68 Mil/uL (ref 4.22–5.81)
RDW: 15 % (ref 11.5–15.5)
WBC: 9.1 10*3/uL (ref 4.0–10.5)

## 2017-07-23 LAB — BASIC METABOLIC PANEL
BUN: 14 mg/dL (ref 6–23)
CHLORIDE: 104 meq/L (ref 96–112)
CO2: 28 meq/L (ref 19–32)
Calcium: 9.8 mg/dL (ref 8.4–10.5)
Creatinine, Ser: 1.03 mg/dL (ref 0.40–1.50)
GFR: 109.66 mL/min (ref >60.00)
GLUCOSE: 91 mg/dL (ref 70–99)
POTASSIUM: 4.3 meq/L (ref 3.5–5.1)
Sodium: 141 mEq/L (ref 135–145)

## 2017-07-23 LAB — TSH: TSH: 1.84 u[IU]/mL (ref 0.35–4.50)

## 2017-07-23 NOTE — Progress Notes (Signed)
HPI:   Tyler Elon Carolanne GrumblingWilson Jr. is a 27 y.o. male, who is here today to establish care.  Former PCP: N/A Last preventive routine visit: He has not had one in years.  Chronic medical problems: Chronic back pain.  He does not exercise regularly and has not been consistent with a healthy diet. No Hx of HTN,HLD,or DM.  Concerns today: 'sciatica."  At least 2 months of lower back pain, woke up with pain and gradually getting worse. It is constant. No Hx of trauma.  Exacerbated by prolonged walking or standing. Alleviated by rest.  Sharp pain radiated to left groin and inner aspect of thigh. He denies saddle a anesthesia or urine/bowel incontinence.  He is on his second steroid taper and also took Naproxen and Flexeril. Pain has improved some but still in pain. 7/10 at rest and 8/10 with walking.   Limitation of activities due to pain.  No fever,chills, abdominal pain,N/V, urinary symptoms,nausea,changes in bowel habits. No Hx of kidney stones.  Noted irregular HR on examination. Denies headache, visual changes, chest pain, dyspnea, palpitation, or edema.  Review of Systems  Constitutional: Negative for activity change, appetite change, fatigue and fever.  HENT: Negative for mouth sores, nosebleeds, sore throat and trouble swallowing.   Eyes: Negative for redness and visual disturbance.  Respiratory: Negative for cough, shortness of breath and wheezing.   Cardiovascular: Negative for chest pain, palpitations and leg swelling.  Gastrointestinal: Negative for abdominal pain, nausea and vomiting.       No changes in bowel habits.   Endocrine: Negative for cold intolerance, heat intolerance, polydipsia, polyphagia and polyuria.  Genitourinary: Negative for decreased urine volume, dysuria and hematuria.  Musculoskeletal: Positive for back pain.  Skin: Negative for rash and wound.  Allergic/Immunologic: Positive for environmental allergies.  Neurological: Positive  for numbness. Negative for seizures, weakness and headaches.  Hematological: Negative for adenopathy. Does not bruise/bleed easily.      Current Outpatient Medications on File Prior to Visit  Medication Sig Dispense Refill  . cyclobenzaprine (FLEXERIL) 5 MG tablet Take 1 tablet (5 mg total) by mouth at bedtime. 30 tablet 1  . naproxen (NAPROSYN) 500 MG tablet Take 1 tablet (500 mg total) by mouth 2 (two) times daily. 60 tablet 1   No current facility-administered medications on file prior to visit.      Past Medical History:  Diagnosis Date  . GERD (gastroesophageal reflux disease)    No Known Allergies  Family History  Problem Relation Age of Onset  . Healthy Mother   . Depression Mother   . Hypertension Mother   . Healthy Father   . Depression Sister   . Autism Son   . Heart disease Maternal Grandmother   . Hyperlipidemia Maternal Grandmother   . Alcohol abuse Maternal Grandfather   . Cancer Maternal Grandfather   . Alzheimer's disease Paternal Grandmother     Social History   Socioeconomic History  . Marital status: Single    Spouse name: Not on file  . Number of children: Not on file  . Years of education: Not on file  . Highest education level: Not on file  Occupational History  . Not on file  Social Needs  . Financial resource strain: Not on file  . Food insecurity:    Worry: Not on file    Inability: Not on file  . Transportation needs:    Medical: Not on file    Non-medical: Not on file  Tobacco  Use  . Smoking status: Never Smoker  . Smokeless tobacco: Never Used  Substance and Sexual Activity  . Alcohol use: Yes  . Drug use: No  . Sexual activity: Yes    Partners: Female  Lifestyle  . Physical activity:    Days per week: Not on file    Minutes per session: Not on file  . Stress: Not on file  Relationships  . Social connections:    Talks on phone: Not on file    Gets together: Not on file    Attends religious service: Not on file     Active member of club or organization: Not on file    Attends meetings of clubs or organizations: Not on file    Relationship status: Not on file  Other Topics Concern  . Not on file  Social History Narrative  . Not on file    Vitals:   07/23/17 0806  BP: 122/84  Pulse: 95  Resp: 12  Temp: 98.4 F (36.9 C)  SpO2: 98%    Body mass index is 37.03 kg/m.   Physical Exam  Nursing note and vitals reviewed. Constitutional: He is oriented to person, place, and time. He appears well-developed. No distress.  HENT:  Head: Normocephalic and atraumatic.  Mouth/Throat: Oropharynx is clear and moist and mucous membranes are normal.  Eyes: Pupils are equal, round, and reactive to light. Conjunctivae and EOM are normal.  Neck: No tracheal deviation present. No thyroid mass and no thyromegaly present.  Cardiovascular: Normal rate. An irregular rhythm present.  No murmur heard. Pulses:      Dorsalis pedis pulses are 2+ on the right side, and 2+ on the left side.  Respiratory: Effort normal and breath sounds normal. No respiratory distress.  GI: Soft. He exhibits no mass. There is no hepatomegaly. There is no tenderness.  Musculoskeletal: He exhibits no edema or tenderness.       Thoracic back: He exhibits no tenderness and no bony tenderness.       Lumbar back: He exhibits no tenderness and no bony tenderness.  Lymphadenopathy:    He has no cervical adenopathy.  Neurological: He is alert and oriented to person, place, and time. He has normal strength. Gait normal.  SLR elicit mild pain left lower back. He is able to walk on tiptoes and heels with no problem.  Skin: Skin is warm. No rash noted. No erythema.  Psychiatric: He has a normal mood and affect. Cognition and memory are normal.  Well groomed, good eye contact.    ASSESSMENT AND PLAN:   Tyler Howard was seen today for establish care.  Diagnoses and all orders for this visit:  Lab Results  Component Value Date   TSH 1.84  07/23/2017   Lab Results  Component Value Date   CREATININE 1.03 07/23/2017   BUN 14 07/23/2017   NA 141 07/23/2017   K 4.3 07/23/2017   CL 104 07/23/2017   CO2 28 07/23/2017   Lab Results  Component Value Date   WBC 9.1 07/23/2017   HGB 15.8 07/23/2017   HCT 47.3 07/23/2017   MCV 83.3 07/23/2017   PLT 296.0 07/23/2017    Left-sided low back pain with left-sided sciatica, unspecified chronicity  Persistent, no significant improvement with steroids and radicular symptoms, so lumbar MRI will be arranged. Instructed about warning signs.  -     MR Lumbar Spine Wo Contrast; Future  Irregular heart rate  Noted omn auscultation but resolved and not recorded on  EKG. EKG today: SR, normal axis and intervals.No other EKG available for comparison.  Asymptomatic. Instructed to monitor HR. Instructed about waning signs. Further recommendations will be given according to lab results.  -     EKG 12-Lead -     CBC -     Basic metabolic panel -     TSH  Encounter for screening for HIV -     HIV antibody  Need for Tdap vaccination -     Tdap vaccine greater than or equal to 7yo IM  Healthcare maintenance  Vaccination updated. Preventive guidelines discussed.      Betty G. [place], MD  [facility]. [place] office.

## 2017-07-23 NOTE — Patient Instructions (Addendum)
A few things to remember from today's visit:   Left-sided low back pain with left-sided sciatica, unspecified chronicity - Plan: MR Lumbar Spine Wo Contrast  Irregular heart rate - Plan: EKG 12-Lead, CBC, Basic metabolic panel, TSH  Encounter for screening for HIV - Plan: HIV antibody   Please be sure medication list is accurate. If a new problem present, please set up appointment sooner than planned today.

## 2017-07-24 LAB — HIV ANTIBODY (ROUTINE TESTING W REFLEX): HIV: NONREACTIVE

## 2017-08-01 ENCOUNTER — Ambulatory Visit
Admission: RE | Admit: 2017-08-01 | Discharge: 2017-08-01 | Disposition: A | Discharge status: Home / Self Care | Payer: 59 | Source: Ambulatory Visit | Attending: Family Medicine | Admitting: Family Medicine

## 2017-08-01 DIAGNOSIS — M5442 Lumbago with sciatica, left side: Secondary | ICD-10-CM

## 2017-08-01 DIAGNOSIS — M48061 Spinal stenosis, lumbar region without neurogenic claudication: Secondary | ICD-10-CM | POA: Diagnosis not present

## 2017-08-04 ENCOUNTER — Other Ambulatory Visit: Payer: Self-pay | Admitting: Family Medicine

## 2017-08-04 DIAGNOSIS — M5442 Lumbago with sciatica, left side: Secondary | ICD-10-CM

## 2017-08-04 DIAGNOSIS — M5416 Radiculopathy, lumbar region: Secondary | ICD-10-CM

## 2017-08-05 ENCOUNTER — Telehealth: Payer: Self-pay | Admitting: Family Medicine

## 2017-08-05 NOTE — Telephone Encounter (Signed)
Patient returning call Copied from CRM [id number]. Topic: Quick Communication - Lab Results >> Aug 05, 2017  9:38 AM Gracelyn NurseBlackwell, Shelby P, [place] wrote: Called patient to inform them of  lab results. When patient returns call, triage nurse may disclose results.

## 2017-08-06 NOTE — Telephone Encounter (Signed)
Patient received results of scan 08/05/17

## 2019-01-20 ENCOUNTER — Telehealth: Payer: Self-pay

## 2019-01-20 NOTE — Telephone Encounter (Signed)
Patient is scheduled at 2:30 tomorrow for a virtual

## 2019-01-20 NOTE — Telephone Encounter (Signed)
Copied from CRM [id number]. Topic: Appointment Scheduling - Scheduling Inquiry for Clinic >> Jan 20, 2019 12:54 PM Burchel, Abbi R wrote: Reason for CRM: Pt would like to sched a virtual visit with Dr Sw[place] as soon as possible.  He is Covid (+). Please call pt to sched: [phone]

## 2019-01-21 ENCOUNTER — Encounter: Payer: Self-pay | Admitting: Family Medicine

## 2019-01-21 ENCOUNTER — Telehealth (INDEPENDENT_AMBULATORY_CARE_PROVIDER_SITE_OTHER): Payer: 59 | Admitting: Family Medicine

## 2019-01-21 VITALS — Ht 72.0 in

## 2019-01-21 DIAGNOSIS — U071 COVID-19: Secondary | ICD-10-CM | POA: Diagnosis not present

## 2019-01-21 DIAGNOSIS — Z7189 Other specified counseling: Secondary | ICD-10-CM

## 2019-01-21 NOTE — Progress Notes (Signed)
Virtual Visit via Video Note   I connected with Mr Muniz on 01/21/19 by a video enabled telemedicine application and verified that I am speaking with the correct person using two identifiers.  Location patient: home Location provider:work office Persons participating in the virtual visit: patient, provider  I discussed the limitations of evaluation and management by telemedicine and the availability of in person appointments. The patient expressed understanding and agreed to proceed.   HPI: Mr. Passey is a 29 year old male with history of obesity and GERD who was recently diagnosed with COVID-19 infection. He notified his employer positive contact with COVID-19 infected person, mother-in-law, on 01/05/2019 He tested positive for COVID-19 infection on 01/12/2019. He had fatigue, rhinorrhea, nasal congestion, sore throat, decreased sense of taste and, and body aches. Most symptoms have resolved. Still having some "chest congestion" and cough. "Low-grade fever", max 99.5 F, last time 2 days ago.  "Every now and then" SOB while at rest and better when changing positions. Negative for chest pain, wheezing, abdominal pain, nausea, vomiting, changes in bowel habits, or a skin rash.  He needs a note for work.  ROS: See pertinent positives and negatives per HPI.  Past Medical History:  Diagnosis Date  . GERD (gastroesophageal reflux disease)     No past surgical history on file.  Family History  Problem Relation Age of Onset  . Healthy Mother   . Depression Mother   . Hypertension Mother   . Healthy Father   . Depression Sister   . Autism Son   . Heart disease Maternal Grandmother   . Hyperlipidemia Maternal Grandmother   . Alcohol abuse Maternal Grandfather   . Cancer Maternal Grandfather   . Alzheimer's disease Paternal Grandmother     Social History   Socioeconomic History  . Marital status: Single    Spouse name: Not on file  . Number of children: Not on file  . Years  of education: Not on file  . Highest education level: Not on file  Occupational History  . Not on file  Tobacco Use  . Smoking status: Never Smoker  . Smokeless tobacco: Never Used  Substance and Sexual Activity  . Alcohol use: Yes  . Drug use: No  . Sexual activity: Yes    Partners: Female  Other Topics Concern  . Not on file  Social History Narrative  . Not on file   Social Determinants of Health   Financial Resource Strain:   . Difficulty of Paying Living Expenses: Not on file  Food Insecurity:   . Worried About Programme researcher, broadcasting/film/video in the Last Year: Not on file  . Ran Out of Food in the Last Year: Not on file  Transportation Needs:   . Lack of Transportation (Medical): Not on file  . Lack of Transportation (Non-Medical): Not on file  Physical Activity:   . Days of Exercise per Week: Not on file  . Minutes of Exercise per Session: Not on file  Stress:   . Feeling of Stress : Not on file  Social Connections:   . Frequency of Communication with Friends and Family: Not on file  . Frequency of Social Gatherings with Friends and Family: Not on file  . Attends Religious Services: Not on file  . Active Member of Clubs or Organizations: Not on file  . Attends Banker Meetings: Not on file  . Marital Status: Not on file  Intimate Partner Violence:   . Fear of Current or Ex-Partner: Not on  file  . Emotionally Abused: Not on file  . Physically Abused: Not on file  . Sexually Abused: Not on file      Current Outpatient Medications:  .  cyclobenzaprine (FLEXERIL) 5 MG tablet, Take 1 tablet (5 mg total) by mouth at bedtime., Disp: 30 tablet, Rfl: 1 .  naproxen (NAPROSYN) 500 MG tablet, Take 1 tablet (500 mg total) by mouth 2 (two) times daily., Disp: 60 tablet, Rfl: 1  EXAM:  VITALS per patient if applicable:Ht 6' (6.712 m)   BMI 37.03 kg/m   GENERAL: alert, oriented, appears well and in no acute distress  HEENT: atraumatic, conjunctiva clear, no obvious  abnormalities on inspection.  NECK: normal movements of the head and neck  LUNGS: on inspection no signs of respiratory distress, breathing rate appears normal, no obvious gross SOB, gasping or wheezing. No cough during visit.  CV: no obvious cyanosis  PSYCH/NEURO: pleasant and cooperative, no obvious depression or anxiety, speech and thought processing grossly intact  ASSESSMENT AND PLAN:  Discussed the following assessment and plan:  COVID-19 virus infection  Educated about COVID-19 virus infection  We discussed signs and symptoms of COVID-19 as well as possible complications and treatment. Most symptoms have resolved and no signs of complications. Explained that cough can last a few more days and even weeks.  I do not think chest imaging is needed at this time.  Continue monitoring for fever. As far as he continues fever free, he can go back to work on 01/26/2019. Continue wearing mask when in public.  Instructed about warning signs.  Note from 01/05/19 to the 01/26/2019. He will have some forms faxed to ask from his employer.   I discussed the assessment and treatment plan with the patient. He was provided an opportunity to ask questions and all were answered. He agreed with the plan and demonstrated an understanding of the instructions.    Return if symptoms worsen or fail to improve.    Clydine Parkison [place], MD

## 2020-09-13 ENCOUNTER — Institutional Professional Consult (permissible substitution): Payer: 59 | Admitting: Pulmonary Disease

## 2020-09-14 ENCOUNTER — Encounter (HOSPITAL_COMMUNITY): Payer: Self-pay | Admitting: *Deleted

## 2020-09-14 ENCOUNTER — Ambulatory Visit (INDEPENDENT_AMBULATORY_CARE_PROVIDER_SITE_OTHER): Payer: 59

## 2020-09-14 ENCOUNTER — Ambulatory Visit (HOSPITAL_COMMUNITY)
Admission: EM | Admit: 2020-09-14 | Discharge: 2020-09-14 | Disposition: A | Discharge status: Home / Self Care | Payer: 59 | Attending: Emergency Medicine | Admitting: Emergency Medicine

## 2020-09-14 DIAGNOSIS — M25572 Pain in left ankle and joints of left foot: Secondary | ICD-10-CM

## 2020-09-14 DIAGNOSIS — S93412A Sprain of calcaneofibular ligament of left ankle, initial encounter: Secondary | ICD-10-CM | POA: Diagnosis not present

## 2020-09-14 MED ORDER — NAPROXEN 500 MG PO TABS: 500.0000 mg | ORAL_TABLET | Freq: Two times a day (BID) | ORAL | 0 refills | Status: AC

## 2020-09-14 NOTE — ED Provider Notes (Signed)
[facility]    CSN: [id number] Arrival date & time: 09/14/20  1736      History   Chief Complaint Chief Complaint  Patient presents with   Ankle Pain    HPI Tyler Howard. is a 30 y.o. male.   Patient states he was mowing the grass 5 days ago and stepped in a shallow hole with his left foot, thinks he may have twisted his left ankle.  States that he began to have ankle pain 2 days ago.  States pain occurs mostly when he is walking as he is putting his heel down, pain improves as he toes off.  Denies pain at rest.  Pain with lateral rotation.  Patient denies history of injury in left ankle.  Patient denies joint pain otherwise.   Ankle Pain  Past Medical History:  Diagnosis Date   GERD (gastroesophageal reflux disease)     Patient Active Problem List   Diagnosis Date Noted   Lower back pain 07/23/2017    History reviewed. No pertinent surgical history.     Home Medications    Prior to Admission medications   Medication Sig Start Date End Date Taking? Authorizing Provider  cyclobenzaprine (FLEXERIL) 5 MG tablet Take 1 tablet (5 mg total) by mouth at bedtime. 07/05/17   Eustace Moore, MD  naproxen (NAPROSYN) 500 MG tablet Take 1 tablet (500 mg total) by mouth 2 (two) times daily for 15 days. 09/14/20 09/29/20  Theadora Rama, NP    Family History Family History  Problem Relation Age of Onset   Healthy Mother    Depression Mother    Hypertension Mother    Healthy Father    Depression Sister    Autism Son    Heart disease Maternal Grandmother    Hyperlipidemia Maternal Grandmother    Alcohol abuse Maternal Grandfather    Cancer Maternal Grandfather    Alzheimer's disease Paternal Grandmother     Social History Social History   Tobacco Use   Smoking status: Never   Smokeless tobacco: Never  Vaping Use   Vaping Use: Never used  Substance Use Topics   Alcohol use: Yes   Drug use: No     Allergies   Patient has no known  allergies.   Review of Systems Review of Systems  Musculoskeletal:  Positive for arthralgias (Left lateral ankle, left side of foot.).    Physical Exam Triage Vital Signs ED Triage Vitals  Enc Vitals Group     BP 09/14/20 1844 (!) 150/106     Pulse Rate 09/14/20 1844 82     Resp 09/14/20 1844 20     Temp 09/14/20 1844 99.4 F (37.4 C)     Temp src --      SpO2 09/14/20 1844 99 %     Weight --      Height --      Head Circumference --      Peak Flow --      Pain Score 09/14/20 1842 7     Pain Loc --      Pain Edu? --      Excl. in GC? --    No data found.  Updated Vital Signs BP (!) 150/106   Pulse 82   Temp 99.4 F (37.4 C)   Resp 20   SpO2 99%   Visual Acuity Right Eye Distance:   Left Eye Distance:   Bilateral Distance:    Right Eye Near:   Left Eye  Near:    Bilateral Near:     Physical Exam Vitals reviewed.  Constitutional:      Appearance: He is obese.  HENT:     Head: Normocephalic and atraumatic.  Cardiovascular:     Rate and Rhythm: Normal rate and regular rhythm.     Pulses: Normal pulses.  Pulmonary:     Effort: Pulmonary effort is normal.     Breath sounds: Normal breath sounds.  Musculoskeletal:        General: Tenderness (Tenderness with external rotation of left ankle.) present.     Right lower leg: Edema (Chronic) present.     Left lower leg: Edema (Chronic) present.  Skin:    General: Skin is warm and dry.  Neurological:     General: No focal deficit present.     Mental Status: He is alert and oriented to person, place, and time. Mental status is at baseline.     Cranial Nerves: No cranial nerve deficit.  Psychiatric:        Mood and Affect: Mood normal.        Behavior: Behavior normal.        Thought Content: Thought content normal.        Judgment: Judgment normal.     UC Treatments / Results  Labs (all labs ordered are listed, but only abnormal results are displayed) Labs Reviewed - No data to  display  EKG   Radiology DG Ankle 2 Views Left  Result Date: 09/14/2020 CLINICAL DATA:  Left lateral foot/ankle pain EXAM: LEFT ANKLE - 2 VIEW COMPARISON:  None. FINDINGS: No fracture or dislocation is seen. The ankle mortise is intact. Mild lateral soft tissue swelling. IMPRESSION: No fracture or dislocation is seen. Mild lateral soft tissue swelling. Electronically Signed   By: Charline Bills M.D.   On: 09/14/2020 19:47    Procedures Procedures (including critical care time)  Medications Ordered in UC Medications - No data to display  Initial Impression / Assessment and Plan / UC Course  I have reviewed the triage vital signs and the nursing notes.  Pertinent labs & imaging results that were available during my care of the patient were reviewed by me and considered in my medical decision making (see chart for details).     XR reviewed by me, per per personal read patient has a soft tissue injury.  Ace wrap applied to left ankle and foot.  Patient advised to follow-up with PCP in 3 days.  Note provided for work. Final Clinical Impressions(s) / UC Diagnoses   Final diagnoses:  Sprain of calcaneofibular ligament of left ankle, initial encounter     Discharge Instructions      Rest, elevation, gentle compression and NSAIDS recommended.       ED Prescriptions     Medication Sig Dispense Auth. Provider   naproxen (NAPROSYN) 500 MG tablet Take 1 tablet (500 mg total) by mouth 2 (two) times daily for 15 days. 30 tablet Theadora Rama, NP      PDMP not reviewed this encounter.   Theadora Rama, NP 09/14/20 2036

## 2020-09-14 NOTE — ED Triage Notes (Signed)
Pt reports RT ankle pain since Monday. Pt reported he did yard work on Sunday.

## 2020-09-14 NOTE — Discharge Instructions (Signed)
Rest, elevation, gentle compression and NSAIDS recommended.

## 2020-10-17 ENCOUNTER — Institutional Professional Consult (permissible substitution): Payer: 59 | Admitting: Pulmonary Disease

## 2020-11-15 ENCOUNTER — Ambulatory Visit (INDEPENDENT_AMBULATORY_CARE_PROVIDER_SITE_OTHER): Payer: 59 | Admitting: Pulmonary Disease

## 2020-11-15 ENCOUNTER — Other Ambulatory Visit: Payer: Self-pay

## 2020-11-15 ENCOUNTER — Encounter: Payer: Self-pay | Admitting: Pulmonary Disease

## 2020-11-15 VITALS — BP 120/78 | HR 98 | Temp 98.4°F | Ht 72.0 in | Wt 355.0 lb

## 2020-11-15 DIAGNOSIS — G4733 Obstructive sleep apnea (adult) (pediatric): Secondary | ICD-10-CM

## 2020-11-15 NOTE — Progress Notes (Signed)
[address] Tyler Howard    [id number]    09/25/1990  Primary Care Physician:Tyler Howard, Tyler Expose, MD  Referring Physician: Osborn Coho, MD [address] [place],  [place] [address]  Chief complaint:   Patient being seen for nonrestorative sleep  HPI:  Snoring, witnessed apneas  Usually goes to bed between 9 PM and 12 Falls asleep easily on some days 3-4 awakenings Final wake up time 4 AM for work  Weight has gradually gone up  Occasional dryness of his mouth in the mornings No sore throat No headache No family history of obstructive sleep apnea known to him  No history of hypertension  He feels his sleep is nonrestorative Never smoker   Outpatient Encounter Medications as of 11/15/2020  Medication Sig   melatonin 1 MG TABS tablet Take 1 mg by mouth at bedtime.   Multiple Vitamin (MULTIVITAMIN WITH MINERALS) TABS tablet Take 1 tablet by mouth daily.   [DISCONTINUED] cyclobenzaprine (FLEXERIL) 5 MG tablet Take 1 tablet (5 mg total) by mouth at bedtime. (Patient not taking: Reported on 11/15/2020)   No facility-administered encounter medications on file as of 11/15/2020.    Allergies as of 11/15/2020   (No Known Allergies)    Past Medical History:  Diagnosis Date   GERD (gastroesophageal reflux disease)     No past surgical history on file.  Family History  Problem Relation Age of Onset   Healthy Mother    Depression Mother    Hypertension Mother    Healthy Father    Depression Sister    Autism Son    Heart disease Maternal Grandmother    Hyperlipidemia Maternal Grandmother    Alcohol abuse Maternal Grandfather    Cancer Maternal Grandfather    Alzheimer's disease Paternal Grandmother     Social History   Socioeconomic History   Marital status: Single    Spouse name: Not on file   Number of children: Not on file   Years of education: Not on file   Highest education level: Not on file  Occupational History   Not on file   Tobacco Use   Smoking status: Never   Smokeless tobacco: Never  Vaping Use   Vaping Use: Never used  Substance and Sexual Activity   Alcohol use: Yes   Drug use: No   Sexual activity: Yes    Partners: Female  Other Topics Concern   Not on file  Social History Narrative   Not on file   Social Determinants of Health   Financial Resource Strain: Not on file  Food Insecurity: Not on file  Transportation Needs: Not on file  Physical Activity: Not on file  Stress: Not on file  Social Connections: Not on file  Intimate Partner Violence: Not on file    Review of Systems  Constitutional:  Positive for fatigue.  Psychiatric/Behavioral:  Positive for sleep disturbance.    Vitals:   11/15/20 1538  BP: 120/78  Pulse: 98  Temp: 98.4 F (36.9 C)  SpO2: 95%     Physical Exam Constitutional:      Appearance: He is obese.  HENT:     Head: Normocephalic.     Mouth/Throat:     Mouth: Mucous membranes are moist.     Comments: Mallampati 4, crowded oropharynx Eyes:     Pupils: Pupils are equal, round, and reactive to light.  Cardiovascular:     Rate and Rhythm: Normal rate and regular rhythm.  Pulses: Normal pulses.     Heart sounds: Normal heart sounds.  Pulmonary:     Effort: No respiratory distress.     Breath sounds: No stridor. No wheezing or rhonchi.  Musculoskeletal:     Cervical back: No rigidity or tenderness.  Neurological:     Mental Status: He is alert.  Psychiatric:        Mood and Affect: Mood normal.   Results of the Epworth flowsheet 11/15/2020  Sitting and reading 0  Watching TV 3  Sitting, inactive in a public place (e.g. a theatre or a meeting) 3  As a passenger in a car for an hour without a break 3  Lying down to rest in the afternoon when circumstances permit 2  Sitting and talking to someone 2  Sitting quietly after a lunch without alcohol 2  In a car, while stopped for a few minutes in traffic 1  Total score 16    Data Reviewed: No  previous study available  Assessment:  Moderate probability of significant obstructive sleep apnea  Excessive daytime sleepiness  Pathophysiology of sleep disordered breathing discussed with the patient Treatment options discussed with the patient  Importance of weight management discussed  Plan/Recommendations: We will obtain a home sleep study  Weight loss encouraged  Adequate number of hours of sleep encouraged  Tentative follow-up in 3 to 4 months   Tyler Diamond MD [facility] 11/15/2020, 4:01 PM  CC: Tyler Coho, MD

## 2020-11-15 NOTE — Patient Instructions (Signed)
We will schedule you for home sleep study Update you with results as soon as reviewed Treatment options as we discussed  Tentative follow-up in 3 to 4 months  Sleep Apnea Sleep apnea affects breathing during sleep. It causes breathing to stop for 10 seconds or more, or to become shallow. People with sleep apnea usually snore loudly. It can also increase the risk of: Heart attack. Stroke. Being very overweight (obese). Diabetes. Heart failure. Irregular heartbeat. High blood pressure. The goal of treatment is to help you breathe normally again. What are the causes? The most common cause of this condition is a collapsed or blocked airway. There are three kinds of sleep apnea: Obstructive sleep apnea. This is caused by a blocked or collapsed airway. Central sleep apnea. This happens when the brain does not send the right signals to the muscles that control breathing. Mixed sleep apnea. This is a combination of obstructive and central sleep apnea. What increases the risk? Being overweight. Smoking. Having a small airway. Being older. Being male. Drinking alcohol. Taking medicines to calm yourself (sedatives or tranquilizers). Having family members with the condition. Having a tongue or tonsils that are larger than normal. What are the signs or symptoms? Trouble staying asleep. Loud snoring. Headaches in the morning. Waking up gasping. Dry mouth or sore throat in the morning. Being sleepy or tired during the day. If you are sleepy or tired during the day, you may also: Not be able to focus your mind (concentrate). Forget things. Get angry a lot and have mood swings. Feel sad (depressed). Have changes in your personality. Have less interest in sex, if you are male. Be unable to have an erection, if you are male. How is this treated?  Sleeping on your side. Using a medicine to get rid of mucus in your nose (decongestant). Avoiding the use of alcohol, medicines to help  you relax, or certain pain medicines (narcotics). Losing weight, if needed. Changing your diet. Quitting smoking. Using a machine to open your airway while you sleep, such as: An oral appliance. This is a mouthpiece that shifts your lower jaw forward. A CPAP device. This device blows air through a mask when you breathe out (exhale). An EPAP device. This has valves that you put in each nostril. A BPAP device. This device blows air through a mask when you breathe in (inhale) and breathe out. Having surgery if other treatments do not work. Follow these instructions at home: Lifestyle Make changes that your doctor recommends. Eat a healthy diet. Lose weight if needed. Avoid alcohol, medicines to help you relax, and some pain medicines. Do not smoke or use any products that contain nicotine or tobacco. If you need help quitting, ask your doctor. General instructions Take over-the-counter and prescription medicines only as told by your doctor. If you were given a machine to use while you sleep, use it only as told by your doctor. If you are having surgery, make sure to tell your doctor you have sleep apnea. You may need to bring your device with you. Keep all follow-up visits. Contact a doctor if: The machine that you were given to use during sleep bothers you or does not seem to be working. You do not get better. You get worse. Get help right away if: Your chest hurts. You have trouble breathing in enough air. You have an uncomfortable feeling in your back, arms, or stomach. You have trouble talking. One side of your body feels weak. A part of your face  is hanging down. These symptoms may be an emergency. Get help right away. Call your local emergency services (911 in the [place] Do not wait to see if the symptoms will go away. Do not drive yourself to the hospital. Summary This condition affects breathing during sleep. The most common cause is a collapsed or blocked airway. The goal  of treatment is to help you breathe normally while you sleep. This information is not intended to replace advice given to you by your health care provider. Make sure you discuss any questions you have with your health care provider. Document Revised: 12/11/2019 Document Reviewed: 12/11/2019 Elsevier Patient Education  2022 [employer].

## 2020-11-16 ENCOUNTER — Telehealth: Payer: Self-pay | Admitting: Family Medicine

## 2020-11-16 NOTE — Telephone Encounter (Signed)
Called pt to offer him the opportunity to get the new covid vaccine. Pt states at this time he is not interested: not at this time. My phone number was given to the pt for him to call back when he is ready.

## 2021-02-01 ENCOUNTER — Other Ambulatory Visit: Payer: Self-pay

## 2021-02-01 ENCOUNTER — Ambulatory Visit (INDEPENDENT_AMBULATORY_CARE_PROVIDER_SITE_OTHER): Payer: 59

## 2021-02-01 DIAGNOSIS — G4733 Obstructive sleep apnea (adult) (pediatric): Secondary | ICD-10-CM | POA: Diagnosis not present

## 2021-02-06 ENCOUNTER — Telehealth: Payer: Self-pay | Admitting: Pulmonary Disease

## 2021-02-06 DIAGNOSIS — G4733 Obstructive sleep apnea (adult) (pediatric): Secondary | ICD-10-CM | POA: Diagnosis not present

## 2021-02-06 NOTE — Telephone Encounter (Signed)
Call patient  Sleep study result  Date of study: 02/01/2021  Impression: Severe obstructive sleep apnea Severe oxygen desaturations  Recommendation: Recommend in lab titration study for severe obstructive sleep apnea and severe oxygen desaturations May need oxygen supplementation with CPAP therapy  Encourage weight loss measures  Follow-up as previously scheduled

## 2021-02-08 NOTE — Telephone Encounter (Signed)
I called the patient and left a message for the patient to call back for results.

## 2021-02-09 ENCOUNTER — Telehealth: Payer: Self-pay | Admitting: Pulmonary Disease

## 2021-02-09 DIAGNOSIS — G4733 Obstructive sleep apnea (adult) (pediatric): Secondary | ICD-10-CM

## 2021-02-10 NOTE — Telephone Encounter (Signed)
Lm for patient.  

## 2021-02-10 NOTE — Telephone Encounter (Signed)
Patient is returning phone call. Patient phone number is [phone].

## 2021-02-10 NOTE — Telephone Encounter (Signed)
Spoke with pt and reviewed sleep study results as dictated by Dr. Ander Slade. Pt stated understanding. Scheduled ROV for pt and order titration study. Nothing further needed at this time.

## 2021-02-10 NOTE — Telephone Encounter (Signed)
See other phone note. Closing encounter.

## 2021-02-10 NOTE — Telephone Encounter (Signed)
Tyler Coder, MD      6:26 PM Note Call patient  Sleep study result  Date of study: 02/01/2021  Impression: Severe obstructive sleep apnea Severe oxygen desaturations  Recommendation: Recommend in lab titration study for severe obstructive sleep apnea and severe oxygen desaturations May need oxygen supplementation with CPAP therapy  Encourage weight loss measures  Follow-up as previously scheduled      Attempted to call pt but unable to reach. Left message for him to return call.

## 2021-03-14 ENCOUNTER — Other Ambulatory Visit: Payer: Self-pay

## 2021-03-14 ENCOUNTER — Ambulatory Visit (HOSPITAL_BASED_OUTPATIENT_CLINIC_OR_DEPARTMENT_OTHER): Payer: 59 | Attending: Pulmonary Disease | Admitting: Pulmonary Disease

## 2021-03-14 DIAGNOSIS — G4733 Obstructive sleep apnea (adult) (pediatric): Secondary | ICD-10-CM

## 2021-03-23 ENCOUNTER — Telehealth: Payer: Self-pay | Admitting: Pulmonary Disease

## 2021-03-23 DIAGNOSIS — G4733 Obstructive sleep apnea (adult) (pediatric): Secondary | ICD-10-CM

## 2021-03-23 NOTE — Telephone Encounter (Signed)
Call patient ? ?Sleep study result ? ?Date of study: ?03/14/2021 ? ?Impression: ?Severe obstructive sleep apnea, adequately treated with BiPAP therapy ? ?Recommendation: ?DME referral ? ?Trial of BiPAP therapy on 20/16 cm H2O with a Medium size Fisher[employer]Paykel Full Face Vitera mask and heated humidification ? ?Encourage weight loss measures ? ?Follow-up in the office 4 to 6 weeks following initiation of treatment ? ?

## 2021-03-23 NOTE — Telephone Encounter (Signed)
I left a message for this patient to call back for his sleep study results.  ?

## 2021-03-23 NOTE — Procedures (Signed)
POLYSOMNOGRAPHY  Last, First: Tyler Howard, Tyler Howard MRN: [id number] Gender: Male Age (years): 30 Weight (lbs): 325 DOB: June 15, 1990 BMI: 44 Primary Care: No PCP Epworth Score: 19 Referring: Tomma Lightning MD Technician: Shelah Lewandowsky Interpreting: Tomma Lightning MD Study Type: BiPAP Ordered Study Type: CPAP Study date: 03/14/2021 Location: [place] CLINICAL INFORMATION Tyler Howard is a 31 year old Male and was referred to the sleep center for evaluation of CPAP efficacy, OSA established on home sleep test, G47.33 OSA: Adult and Pediatric (327.23). Indications include Daytime Fatigue, Established OSA, Excessive Daytime Sleepiness, Obesity, Restless Sleep with Limb Movments, Sleep walking/talking/parasomnias, Snoring.  MEDICATIONS Patient self administered medications include: N/A. Medications administered during study include No sleep medicine administered.  SLEEP STUDY TECHNIQUE The patient underwent an attended overnight polysomnography titration to assess the effects of BIPAP therapy. The following variables were monitored: EEG(C4-A1, C3-A2, O1-A2, O2-A1), EOG, submental and leg EMG, ECG, oxyhemoglobin saturation by pulse oximetry, thoracic and abdominal respiratory effort belts, nasal/oral airflow by pressure sensor, body position sensor and snoring sensor. BIPAP pressure was titrated to eliminate apneas, hypopneas and oxygen desaturation. Hypopneas were scored per AASM definition IB (4% desaturation)  TECHNICIAN COMMENTS Comments added by Technician: None Comments added by Scorer: N/A SLEEP ARCHITECTURE The study was initiated at 9:48:27 PM and terminated at 5:56:26 AM. Total recorded time was 488 minutes. EEG confirmed total sleep time was 462 minutes yielding a sleep efficiency of 94.7%%. Sleep onset after lights out was 6.4 minutes with a REM latency of 44.0 minutes. The patient spent 2.2%% of the night in stage N1 sleep, 48.3%% in stage N2 sleep, 0.0%% in stage N3 and  49.6% in REM. The Arousal Index was 7.7/hour. RESPIRATORY PARAMETERS The overall AHI was 15.1 per hour, and the RDI was 15.6 events/hour with a central apnea index of 0.5per hour. The most appropriate setting of BiPAP was 20/16 cm H2O. At this setting, the sleep efficiency was 100 % and the patient was supine for 100%. The AHI was 3 events per hour, and the RDI was 3 events/hour (with 0.5 central events) and the arousal index was 1 per hour.The oxygen nadir was 84.0% during sleep.    The cumulative time under 88% oxygen saturation was 5.5 minutes  LEG MOVEMENT DATA The total leg movements were 0 with a resulting leg movement index of 0.0. Associated arousal with leg movement index was 0.0. CARDIAC DATA The underlying cardiac rhythm was most consistent with sinus rhythm. Mean heart rate during sleep was 73.6 bpm. Additional rhythm abnormalities include PVCs.  IMPRESSIONS - Severe Obstructive Sleep apnea(OSA) Optimal pressure attained. - Electrocardiographic data showed presence of PVCs. - Severe Oxygen Desaturation - The patient snored with moderate snoring volume. - No significant periodic leg movements(PLMs) during sleep. However, no significant associated arousals.  DIAGNOSIS - Severe Obstructive Sleep Apnea (G47.33)  RECOMMENDATIONS - Trial of BiPAP therapy on 20/16 cm H2O with a Medium size Fisher[employer]kel Full Face Vitera mask and heated humidification. - Avoid alcohol, sedatives and other CNS depressants that may worsen sleep apnea and disrupt normal sleep architecture. - Sleep hygiene should be reviewed to assess factors that may improve sleep quality. - Weight management and regular exercise should be initiated or continued. - Return to Sleep Center for re-evaluation after 4 weeks of therapy  [Electronically signed] 03/23/2021 06:47 AM  Virl Diamond MD NPI: [id number]

## 2021-03-24 NOTE — Telephone Encounter (Signed)
Spoke with the pt and notified of results of sleep study. Pt verbalized understanding and was agreeable to CPAP therapy. I have placed DME referral for this and pt aware to contact the office for 31-90 day f/u once they begin using machine per insurance requirement.   

## 2021-03-28 ENCOUNTER — Ambulatory Visit: Payer: 59 | Admitting: Pulmonary Disease

## 2021-04-24 ENCOUNTER — Encounter (HOSPITAL_COMMUNITY): Payer: Self-pay | Admitting: Emergency Medicine

## 2021-04-24 ENCOUNTER — Other Ambulatory Visit: Payer: Self-pay

## 2021-04-24 ENCOUNTER — Emergency Department (HOSPITAL_COMMUNITY)
Admission: EM | Admit: 2021-04-24 | Discharge: 2021-04-24 | Disposition: A | Discharge status: Home / Self Care | Payer: 59 | Attending: Emergency Medicine | Admitting: Emergency Medicine

## 2021-04-24 DIAGNOSIS — H9201 Otalgia, right ear: Secondary | ICD-10-CM | POA: Diagnosis present

## 2021-04-24 DIAGNOSIS — B369 Superficial mycosis, unspecified: Secondary | ICD-10-CM | POA: Insufficient documentation

## 2021-04-24 MED ORDER — CLOTRIMAZOLE 1 % EX SOLN: CUTANEOUS | 0 refills | Status: AC

## 2021-04-24 MED ORDER — ACETAMINOPHEN 500 MG PO TABS
1000.0000 mg | ORAL_TABLET | Freq: Once | ORAL | Status: AC
  Administered 2021-04-24: 1000 mg via ORAL
  Filled 2021-04-24: qty 2

## 2021-04-24 MED ORDER — CLOTRIMAZOLE 1 % EX SOLN: CUTANEOUS | 0 refills | Status: DC

## 2021-04-24 NOTE — ED Triage Notes (Signed)
Pt c/o right ear pain x 3 days

## 2021-04-24 NOTE — ED Provider Notes (Signed)
?[facility] ?Provider Note ? ?CSN: [id number] ?Arrival date & time: 04/24/21 0240 ? ?Chief Complaint(s) ?Otalgia ? ?HPI ?Tyler MoodJeffery Elon Carolanne GrumblingWilson Jr. is a 31 y.o. male presents with several days of right ear pain gradually worsened since onset.  He denies any trauma.  Reports having had upper respiratory infection in the last several weeks.  No fevers or chills.  No hearing loss.  No other physical complaints. ? ? ?Otalgia ? ?Past Medical History ?Past Medical History:  ?Diagnosis Date  ? GERD (gastroesophageal reflux disease)   ? ?Patient Active Problem List  ? Diagnosis Date Noted  ? OSA (obstructive sleep apnea) 03/14/2021  ? Lower back pain 07/23/2017  ? ?Home Medication(s) ?Prior to Admission medications   ?Medication Sig Start Date End Date Taking? Authorizing Provider  ?clotrimazole (LOTRIMIN) 1 % external solution 2-3 drops in the right ear 3 times per day for 14 days 04/24/21   Kamyiah Colantonio, Amadeo GarnetPedro Eduardo, MD  ?melatonin 1 MG TABS tablet Take 1 mg by mouth at bedtime.    [provider]  ?Multiple Vitamin (MULTIVITAMIN WITH MINERALS) TABS tablet Take 1 tablet by mouth daily.    [provider]  ?                                                                                                                                  ?Allergies ?Patient has no known allergies. ? ?Review of Systems ?Review of Systems  ?HENT:  Positive for ear pain.   ?As noted in HPI ? ?Physical Exam ?Vital Signs  ?I have reviewed the triage vital signs ?BP (!) 138/101 (BP Location: Right Arm)   Pulse (!) 104   Temp 99.1 ?F (37.3 ?C) (Oral)   Resp 17   Ht 6' (1.829 m)   Wt (!) 154.2 kg   SpO2 95%   BMI 46.11 kg/m?  ? ?Physical Exam ?Vitals reviewed.  ?Constitutional:   ?   General: He is not in acute distress. ?   Appearance: He is well-developed. He is not diaphoretic.  ?HENT:  ?   Head: Normocephalic and atraumatic.  ?   Right Ear: External ear normal. Drainage (white and black  exudative build up with hairlike projections) and tenderness present. Tympanic membrane is injected. Tympanic membrane is not scarred or perforated.  ?   Left Ear: Tympanic membrane and external ear normal.  ?   Nose: Nose normal.  ?   Mouth/Throat:  ?   Mouth: Mucous membranes are moist.  ?Eyes:  ?   General: No scleral icterus. ?   Conjunctiva/sclera: Conjunctivae normal.  ?Neck:  ?   Trachea: Phonation normal.  ?Cardiovascular:  ?   Rate and Rhythm: Normal rate and regular rhythm.  ?Pulmonary:  ?   Effort: Pulmonary effort is normal. No respiratory distress.  ?   Breath sounds: No stridor.  ?Abdominal:  ?   General: There is no distension.  ?  Musculoskeletal:     ?   General: Normal range of motion.  ?   Cervical back: Normal range of motion.  ?Neurological:  ?   Mental Status: He is alert and oriented to person, place, and time.  ?Psychiatric:     ?   Behavior: Behavior normal.  ? ? ?ED Results and Treatments ?Labs ?(all labs ordered are listed, but only abnormal results are displayed) ?Labs Reviewed - No data to display                                                                                                                       ?EKG ? EKG Interpretation ? ?Date/Time:    ?Ventricular Rate:    ?PR Interval:    ?QRS Duration:   ?QT Interval:    ?QTC Calculation:   ?R Axis:     ?Text Interpretation:   ?  ? ?  ? ?Radiology ?No results found. ? ?Pertinent labs & imaging results that were available during my care of the patient were reviewed by me and considered in my medical decision making (see MDM for details). ? ?Medications Ordered in ED ?Medications  ?acetaminophen (TYLENOL) tablet 1,000 mg (has no administration in time range)  ?                                                               ?                                                                    ?Procedures ?Procedures ? ?(including critical care time) ? ?Medical Decision Making / ED Course ? ? ? Complexity of Problem: ? ? ?Patient's  presenting problem/concern, DDX, and MDM listed below: ?Right otalgia ?Exam consistent with otitis externa appears to be fungal. ? ?Initial Intervention:  ?Tylenol for pain ? ?  Complexity of Data: ?  ?  ?ED Course:   ? ?Assessment, Add'l Intervention, and Reassessment: ?Right otomycosis ?Clotrimazole drops Rx. ?PCP follow up  ? ? ?Final Clinical Impression(s) / ED Diagnoses ?Final diagnoses:  ?Otomycosis of right ear  ? ?The patient appears reasonably screened and/or stabilized for discharge and I doubt any other medical condition or other [facility] requiring further screening, evaluation, or treatment in the ED at this time prior to discharge. Safe for discharge with strict return precautions. ? ?Disposition: Discharge ? ?Condition: Good ? ?I have discussed the results, Dx and Tx plan with the patient/family who expressed understanding and agree(s) with the plan. Discharge instructions discussed at length. The patient/family was  given strict return precautions who verbalized understanding of the instructions. No further questions at time of discharge.  ? ? ?ED Discharge Orders   ? ?      Ordered  ?  clotrimazole (LOTRIMIN) 1 % external solution       ? 04/24/21 0425  ? ?  ?  ? ?  ? ? ?Follow Up: ?[place], Betty G, MD ?[id number] ?[place] [address] ?[phone] ? ?Call  ?to schedule an appointment for close follow up ? ? ? ? ?  ? ? ? ? ? ?This chart was dictated using voice recognition software.  Despite best efforts to proofread,  errors can occur which can change the documentation meaning. ? ?  ?Nira Conn, MD ?04/24/21 0430 ? ?

## 2021-07-08 ENCOUNTER — Encounter (HOSPITAL_COMMUNITY): Payer: Self-pay | Admitting: Emergency Medicine

## 2021-07-08 ENCOUNTER — Ambulatory Visit (HOSPITAL_COMMUNITY)
Admission: EM | Admit: 2021-07-08 | Discharge: 2021-07-08 | Disposition: A | Discharge status: Home / Self Care | Payer: 59 | Attending: Internal Medicine | Admitting: Internal Medicine

## 2021-07-08 DIAGNOSIS — R0981 Nasal congestion: Secondary | ICD-10-CM | POA: Diagnosis not present

## 2021-07-08 DIAGNOSIS — K047 Periapical abscess without sinus: Secondary | ICD-10-CM

## 2021-07-08 HISTORY — DX: Sleep apnea, unspecified: G47.30

## 2021-07-08 MED ORDER — AMOXICILLIN-POT CLAVULANATE 875-125 MG PO TABS: 1.0000 | ORAL_TABLET | Freq: Two times a day (BID) | ORAL | 0 refills | Status: DC

## 2021-07-08 MED ORDER — FLUTICASONE PROPIONATE 50 MCG/ACT NA SUSP: 2.0000 | Freq: Every day | NASAL | 2 refills | Status: DC

## 2021-07-08 MED ORDER — IBUPROFEN 600 MG PO TABS: 600.0000 mg | ORAL_TABLET | Freq: Four times a day (QID) | ORAL | 0 refills | Status: DC | PRN

## 2022-02-20 ENCOUNTER — Other Ambulatory Visit: Payer: Self-pay

## 2022-02-20 ENCOUNTER — Emergency Department (HOSPITAL_COMMUNITY)
Admission: EM | Admit: 2022-02-20 | Discharge: 2022-02-20 | Disposition: A | Discharge status: Home / Self Care | Payer: 59 | Attending: Emergency Medicine | Admitting: Emergency Medicine

## 2022-02-20 ENCOUNTER — Emergency Department (HOSPITAL_COMMUNITY): Payer: 59

## 2022-02-20 DIAGNOSIS — R200 Anesthesia of skin: Secondary | ICD-10-CM | POA: Diagnosis present

## 2022-02-20 DIAGNOSIS — R202 Paresthesia of skin: Secondary | ICD-10-CM | POA: Diagnosis not present

## 2022-02-20 LAB — COMPREHENSIVE METABOLIC PANEL
ALT: 30 U/L (ref 0–44)
AST: 30 U/L (ref 15–41)
Albumin: 3.9 g/dL (ref 3.5–5.0)
Alkaline Phosphatase: 73 U/L (ref 38–126)
Anion gap: 10 (ref 5–15)
BUN: 13 mg/dL (ref 6–20)
CO2: 25 mmol/L (ref 22–32)
Calcium: 8.9 mg/dL (ref 8.9–10.3)
Chloride: 103 mmol/L (ref 98–111)
Creatinine, Ser: 1.07 mg/dL (ref 0.61–1.24)
GFR, Estimated: >60 mL/min (ref >60)
Glucose, Bld: 112 mg/dL — ABNORMAL HIGH (ref 70–99)
Potassium: 3.9 mmol/L (ref 3.5–5.1)
Sodium: 138 mmol/L (ref 135–145)
Total Bilirubin: 0.6 mg/dL (ref 0.3–1.2)
Total Protein: 7.5 g/dL (ref 6.5–8.1)

## 2022-02-20 LAB — CBC
HCT: 47.1 % (ref 39.0–52.0)
Hemoglobin: 15.4 g/dL (ref 13.0–17.0)
MCH: 27.7 pg (ref 26.0–34.0)
MCHC: 32.7 g/dL (ref 30.0–36.0)
MCV: 84.7 fL (ref 80.0–100.0)
Platelets: 281 10*3/uL (ref 150–400)
RBC: 5.56 MIL/uL (ref 4.22–5.81)
RDW: 14.9 % (ref 11.5–15.5)
WBC: 10.2 10*3/uL (ref 4.0–10.5)
nRBC: 0 % (ref 0.0–0.2)

## 2022-02-20 LAB — DIFFERENTIAL
Abs Immature Granulocytes: 0.04 10*3/uL (ref 0.00–0.07)
Basophils Absolute: 0 10*3/uL (ref 0.0–0.1)
Basophils Relative: 0 %
Eosinophils Absolute: 0.2 10*3/uL (ref 0.0–0.5)
Eosinophils Relative: 2 %
Immature Granulocytes: 0 %
Lymphocytes Relative: 11 %
Lymphs Abs: 1.1 10*3/uL (ref 0.7–4.0)
Monocytes Absolute: 0.5 10*3/uL (ref 0.1–1.0)
Monocytes Relative: 5 %
Neutro Abs: 8.3 10*3/uL — ABNORMAL HIGH (ref 1.7–7.7)
Neutrophils Relative %: 82 %

## 2022-02-20 LAB — PROTIME-INR
INR: 1 (ref 0.8–1.2)
Prothrombin Time: 13.5 seconds (ref 11.4–15.2)

## 2022-02-20 LAB — CBG MONITORING, ED: Glucose-Capillary: 119 mg/dL — ABNORMAL HIGH (ref 70–99)

## 2022-02-20 LAB — ETHANOL: Alcohol, Ethyl (B): <10 mg/dL (ref <10)

## 2022-02-20 LAB — TROPONIN I (HIGH SENSITIVITY)
Troponin I (High Sensitivity): 10 ng/L (ref <18)
Troponin I (High Sensitivity): 7 ng/L (ref <18)

## 2022-02-20 LAB — APTT: aPTT: 26 seconds (ref 24–36)

## 2022-02-20 MED ORDER — SODIUM CHLORIDE 0.9% FLUSH: 3.0000 mL | Freq: Once | INTRAVENOUS | Status: DC

## 2022-02-20 MED ORDER — IOHEXOL 350 MG/ML SOLN
75.0000 mL | Freq: Once | INTRAVENOUS | Status: AC | PRN
  Administered 2022-02-20: 75 mL via INTRAVENOUS

## 2022-02-20 NOTE — ED Provider Notes (Signed)
[place] Provider Note   CSN: [id number] Arrival date & time: 02/20/22  0631     History  Chief Complaint  Patient presents with   Numbness    Tyler Pickup Jafeth Mustin. is a 32 y.o. male.  HPI Patient presents for distal left upper extremity numbness.  Medical history includes GERD, sleep apnea, obesity.  Patient woke up in his normal state of health.  Shortly after he got to work, at around 5 AM, he felt some numbness in left hand and forearm.  He denies any other symptoms at that time.  He was not exerting himself at onset of symptoms.  Since onset, symptoms have improved.  He now endorses some mild paresthesias in his left hand.    Home Medications Prior to Admission medications   Medication Sig Start Date End Date Taking? Authorizing Provider  clotrimazole (LOTRIMIN) 1 % external solution 2-3 drops in the right ear 3 times per day for 14 days 04/24/21   Cardama, Grayce Sessions, MD  melatonin 1 MG TABS tablet Take 1 mg by mouth at bedtime.    [provider]  Multiple Vitamin (MULTIVITAMIN WITH MINERALS) TABS tablet Take 1 tablet by mouth daily.    [provider]      Allergies    Patient has no known allergies.    Review of Systems   Review of Systems  Neurological:  Positive for numbness.  All other systems reviewed and are negative.   Physical Exam Updated Vital Signs BP (!) 123/58   Pulse 77   Temp 98.7 F (37.1 C) (Oral)   Resp (!) 29   SpO2 95%  Physical Exam Vitals and nursing note reviewed.  Constitutional:      General: He is not in acute distress.    Appearance: Normal appearance. He is well-developed. He is not ill-appearing, toxic-appearing or diaphoretic.  HENT:     Head: Normocephalic and atraumatic.     Right Ear: External ear normal.     Left Ear: External ear normal.     Nose: Nose normal.     Mouth/Throat:     Mouth: Mucous membranes are moist.  Eyes:     Extraocular  Movements: Extraocular movements intact.     Conjunctiva/sclera: Conjunctivae normal.  Cardiovascular:     Rate and Rhythm: Normal rate and regular rhythm.  Pulmonary:     Effort: Pulmonary effort is normal. No respiratory distress.  Abdominal:     General: There is no distension.     Palpations: Abdomen is soft.     Tenderness: There is no abdominal tenderness.  Musculoskeletal:        General: No swelling. Normal range of motion.     Cervical back: Normal range of motion and neck supple.     Right lower leg: No edema.     Left lower leg: No edema.  Skin:    General: Skin is warm and dry.     Capillary Refill: Capillary refill takes less than 2 seconds.     Coloration: Skin is not jaundiced or pale.  Neurological:     General: No focal deficit present.     Mental Status: He is alert and oriented to person, place, and time.     Cranial Nerves: No cranial nerve deficit.     Sensory: No sensory deficit.     Motor: No weakness.     Coordination: Coordination normal.  Psychiatric:  Mood and Affect: Mood normal.        Behavior: Behavior normal.        Thought Content: Thought content normal.        Judgment: Judgment normal.     ED Results / Procedures / Treatments   Labs (all labs ordered are listed, but only abnormal results are displayed) Labs Reviewed  DIFFERENTIAL - Abnormal; Notable for the following components:      Result Value   Neutro Abs 8.3 (*)    All other components within normal limits  COMPREHENSIVE METABOLIC PANEL - Abnormal; Notable for the following components:   Glucose, Bld 112 (*)    All other components within normal limits  CBG MONITORING, ED - Abnormal; Notable for the following components:   Glucose-Capillary 119 (*)    All other components within normal limits  PROTIME-INR  APTT  CBC  ETHANOL  TROPONIN I (HIGH SENSITIVITY)  TROPONIN I (HIGH SENSITIVITY)    EKG EKG Interpretation  Date/Time:  Tuesday February 20 2022 06:36:02  EST Ventricular Rate:  96 PR Interval:  144 QRS Duration: 74 QT Interval:  332 QTC Calculation: 419 R Axis:   118 Text Interpretation: Normal sinus rhythm Right axis deviation Confirmed by Godfrey Pick ([id number]) on 02/20/2022 8:06:45 AM  Radiology CT ANGIO HEAD NECK W WO CM  Result Date: 02/20/2022 CLINICAL DATA:  Transient ischemic attack EXAM: CT ANGIOGRAPHY HEAD AND NECK TECHNIQUE: Multidetector CT imaging of the head and neck was performed using the standard protocol during bolus administration of intravenous contrast. Multiplanar CT image reconstructions and MIPs were obtained to evaluate the vascular anatomy. Carotid stenosis measurements (when applicable) are obtained utilizing NASCET criteria, using the distal internal carotid diameter as the denominator. RADIATION DOSE REDUCTION: This exam was performed according to the departmental dose-optimization program which includes automated exposure control, adjustment of the mA and/or kV according to patient size and/or use of iterative reconstruction technique. CONTRAST:  28mL OMNIPAQUE IOHEXOL 350 MG/ML SOLN COMPARISON:  None Available. FINDINGS: CT HEAD FINDINGS Brain: The brain shows a normal appearance without evidence of malformation, atrophy, old or acute small or large vessel infarction, mass lesion, hemorrhage, hydrocephalus or extra-axial collection. Vascular: No hyperdense vessel. No evidence of atherosclerotic calcification. Skull: Normal.  No traumatic finding.  No focal bone lesion. Sinuses/Orbits: Sinuses are clear. Orbits appear normal. Mastoids are clear. Other: None significant CTA NECK FINDINGS Aortic arch: Normal Right carotid system: Normal. No atherosclerotic disease or dissection. Left carotid system: Similarly normal. Vertebral arteries: Dominant left vertebral artery is normal an widely patent. Tiny right vertebral artery is patent throughout the region. Skeleton: No significant finding.  See results of cervical spine CT. Other neck:  No mass or lymphadenopathy. Upper chest: Normal Review of the MIP images confirms the above findings CTA HEAD FINDINGS Anterior circulation: Both internal carotid arteries are patent through the skull base and siphon regions. Siphons are normal. The anterior and middle cerebral vessels are normal. No large vessel occlusion or proximal stenosis. No aneurysm or vascular malformation. Posterior circulation: Both vertebral arteries are patent through the foramen magnum. The right terminates in PICA. The left vertebral artery supplies the basilar artery. No basilar stenosis. Posterior circulation branch vessels are normal. Venous sinuses: Patent and normal. Anatomic variants: None significant. Review of the MIP images confirms the above findings Cervical spine CT: Kyphotic curvature, likely positional. No evidence of fracture or focal bone lesion. No bony stenosis of the canal or foramina. Minimal uncovertebral prominence at C5-6 and C6-7. No  facet arthropathy. IMPRESSION: 1. Normal head CT. 2. Normal CT angiography of the head and neck. 3. Negative cervical spine CT. Minimal uncovertebral prominence at C5-6 and C6-7 but no evidence of stenosis. Electronically Signed   By: Nelson Chimes M.D.   On: 02/20/2022 14:52   CT C-SPINE NO CHARGE  Result Date: 02/20/2022 CLINICAL DATA:  Transient ischemic attack EXAM: CT ANGIOGRAPHY HEAD AND NECK TECHNIQUE: Multidetector CT imaging of the head and neck was performed using the standard protocol during bolus administration of intravenous contrast. Multiplanar CT image reconstructions and MIPs were obtained to evaluate the vascular anatomy. Carotid stenosis measurements (when applicable) are obtained utilizing NASCET criteria, using the distal internal carotid diameter as the denominator. RADIATION DOSE REDUCTION: This exam was performed according to the departmental dose-optimization program which includes automated exposure control, adjustment of the mA and/or kV according to  patient size and/or use of iterative reconstruction technique. CONTRAST:  79mL OMNIPAQUE IOHEXOL 350 MG/ML SOLN COMPARISON:  None Available. FINDINGS: CT HEAD FINDINGS Brain: The brain shows a normal appearance without evidence of malformation, atrophy, old or acute small or large vessel infarction, mass lesion, hemorrhage, hydrocephalus or extra-axial collection. Vascular: No hyperdense vessel. No evidence of atherosclerotic calcification. Skull: Normal.  No traumatic finding.  No focal bone lesion. Sinuses/Orbits: Sinuses are clear. Orbits appear normal. Mastoids are clear. Other: None significant CTA NECK FINDINGS Aortic arch: Normal Right carotid system: Normal. No atherosclerotic disease or dissection. Left carotid system: Similarly normal. Vertebral arteries: Dominant left vertebral artery is normal an widely patent. Tiny right vertebral artery is patent throughout the region. Skeleton: No significant finding.  See results of cervical spine CT. Other neck: No mass or lymphadenopathy. Upper chest: Normal Review of the MIP images confirms the above findings CTA HEAD FINDINGS Anterior circulation: Both internal carotid arteries are patent through the skull base and siphon regions. Siphons are normal. The anterior and middle cerebral vessels are normal. No large vessel occlusion or proximal stenosis. No aneurysm or vascular malformation. Posterior circulation: Both vertebral arteries are patent through the foramen magnum. The right terminates in PICA. The left vertebral artery supplies the basilar artery. No basilar stenosis. Posterior circulation branch vessels are normal. Venous sinuses: Patent and normal. Anatomic variants: None significant. Review of the MIP images confirms the above findings Cervical spine CT: Kyphotic curvature, likely positional. No evidence of fracture or focal bone lesion. No bony stenosis of the canal or foramina. Minimal uncovertebral prominence at C5-6 and C6-7. No facet arthropathy.  IMPRESSION: 1. Normal head CT. 2. Normal CT angiography of the head and neck. 3. Negative cervical spine CT. Minimal uncovertebral prominence at C5-6 and C6-7 but no evidence of stenosis. Electronically Signed   By: Nelson Chimes M.D.   On: 02/20/2022 14:52    Procedures Procedures    Medications Ordered in ED Medications  sodium chloride flush (NS) 0.9 % injection 3 mL (3 mLs Intravenous Not Given 02/20/22 0646)  iohexol (OMNIPAQUE) 350 MG/ML injection 75 mL (75 mLs Intravenous Contrast Given 02/20/22 1043)    ED Course/ Medical Decision Making/ A&P                             Medical Decision Making Amount and/or Complexity of Data Reviewed Labs: ordered. Radiology: ordered.  Risk Prescription drug management.   This patient presents to the ED for concern of left arm numbness, this involves an extensive number of treatment options, and is a complaint that  carries with it a high risk of complications and morbidity.  The differential diagnosis includes CVA, TIA, ACS, radiculopathy   Co morbidities that complicate the patient evaluation  OSA, GERD, obesity   Additional history obtained:  Additional history obtained from N/A External records from outside source obtained and reviewed including EMR   Lab Tests:  I Ordered, and personally interpreted labs.  The pertinent results include: Normal kidney function, normal electrolytes, normal glucose, normal hemoglobin, no leukocytosis, troponins are normal x 2   Imaging Studies ordered:  I ordered imaging studies including CTA head and neck, CT C-spine I independently visualized and interpreted imaging which showed no acute findings I agree with the radiologist interpretation   Cardiac Monitoring: / EKG:  The patient was maintained on a cardiac monitor.  I personally viewed and interpreted the cardiac monitored which showed an underlying rhythm of: Sinus rhythm  Problem List / ED Course / Critical interventions / Medication  management  Patient is a 32 year old male presenting for an episode of left upper extremity numbness.  Onset was 5 AM and numbness has since resolved.  He now endorses a mild paresthesias in area of left hand.  On arrival in the ED, he is well-appearing.  He has no focal neurologic deficits on exam.  He denies any diminished sensation to his left hand.  Initial lab work is unremarkable.  EKG shows no concerning ST segment changes.  Will check troponins for possible referred symptoms from ACS.  Will also check CTA of head and neck to assess for areas of vessel stenosis.  Symptoms could also be secondary from a radiculopathy.  CT C-spine no charge was ordered.  While in the ED, he did not have any worsening of symptoms.  CT imaging showed no acute findings.  He does not have any significant cervical stenosis.  Troponins are normal x 2.  Patient likely has peripheral nerve irritation/impingement.  His symptoms are minimal.  He is stable for discharge.  Social Determinants of Health:  Has PCP         Final Clinical Impression(s) / ED Diagnoses Final diagnoses:  Paresthesia    Rx / DC Orders ED Discharge Orders     None         Gloris Manchester, MD 02/20/22 1527

## 2022-02-20 NOTE — Discharge Instructions (Signed)
Take ibuprofen and Tylenol as needed for pain.  Follow-up with your primary care doctor.

## 2022-02-20 NOTE — ED Triage Notes (Addendum)
Pt arrives to ED with left arm numbness while at work around 5 am. Numbness has since improved. No weakness. No slurred speech. No vision changes. No dizziness. Grip equal bilateral. Pt A&Ox4 + ambulatory in triage.

## 2022-02-20 NOTE — ED Notes (Signed)
Back from CT

## 2022-11-16 ENCOUNTER — Ambulatory Visit (HOSPITAL_COMMUNITY)
Admission: EM | Admit: 2022-11-16 | Discharge: 2022-11-16 | Disposition: A | Discharge status: Home / Self Care | Payer: 59 | Attending: Internal Medicine | Admitting: Internal Medicine

## 2022-11-16 ENCOUNTER — Encounter (HOSPITAL_COMMUNITY): Payer: Self-pay

## 2022-11-16 DIAGNOSIS — R350 Frequency of micturition: Secondary | ICD-10-CM

## 2022-11-16 LAB — POCT URINALYSIS DIP (MANUAL ENTRY)
Bilirubin, UA: NEGATIVE
Blood, UA: NEGATIVE
Glucose, UA: NEGATIVE mg/dL
Ketones, POC UA: NEGATIVE mg/dL
Leukocytes, UA: NEGATIVE
Nitrite, UA: NEGATIVE
Protein Ur, POC: 100 mg/dL — AB
Spec Grav, UA: 1.02
Urobilinogen, UA: 0.2 U/dL
pH, UA: 6

## 2022-11-16 LAB — POCT FASTING CBG KUC MANUAL ENTRY: POCT Glucose (KUC): 83 mg/dL (ref 70–99)

## 2022-11-16 NOTE — Discharge Instructions (Signed)
Please contact your primary care provider first thing Monday morning for follow up. You can also scan the QR code on the last page to get established with a new primary care provider who may have sooner appointments  Please return with any concerns or new symptoms

## 2022-11-16 NOTE — ED Provider Notes (Signed)
[facility]    CSN: [id number] Arrival date & time: 11/16/22  1525      History   Chief Complaint Chief Complaint  Patient presents with   Urinary Frequency    HPI Tyler Howard. is a 32 y.o. male.  3 week history of urinary frequency and sweet urine odor Not having nausea, vomiting, diarrhea, constipation. No fevers. Denies history of this, no hx DM He reports stopping alcohol intake recently. Has switched from sodas to [employer] energy drinks.  Past Medical History:  Diagnosis Date   GERD (gastroesophageal reflux disease)    Sleep apnea     Patient Active Problem List   Diagnosis Date Noted   OSA (obstructive sleep apnea) 03/14/2021   Lower back pain 07/23/2017    History reviewed. No pertinent surgical history.     Home Medications    Prior to Admission medications   Medication Sig Start Date End Date Taking? Authorizing Provider  clotrimazole (LOTRIMIN) 1 % external solution 2-3 drops in the right ear 3 times per day for 14 days 04/24/21   Cardama, Amadeo Garnet, MD  melatonin 1 MG TABS tablet Take 1 mg by mouth at bedtime.    [provider]  Multiple Vitamin (MULTIVITAMIN WITH MINERALS) TABS tablet Take 1 tablet by mouth daily.    [provider]    Family History Family History  Problem Relation Age of Onset   Healthy Mother    Depression Mother    Hypertension Mother    Healthy Father    Depression Sister    Autism Son    Heart disease Maternal Grandmother    Hyperlipidemia Maternal Grandmother    Alcohol abuse Maternal Grandfather    Cancer Maternal Grandfather    Alzheimer's disease Paternal Grandmother     Social History Social History   Tobacco Use   Smoking status: Never   Smokeless tobacco: Never  Vaping Use   Vaping status: Never Used  Substance Use Topics   Alcohol use: Yes   Drug use: No     Allergies   Patient has no known allergies.   Review of Systems Review of Systems   Genitourinary:  Positive for frequency.   Per HPI  Physical Exam Triage Vital Signs ED Triage Vitals  Encounter Vitals Group     BP 11/16/22 1644 (!) 134/91     Systolic BP Percentile --      Diastolic BP Percentile --      Pulse Rate 11/16/22 1644 85     Resp 11/16/22 1644 18     Temp 11/16/22 1644 98.7 F (37.1 C)     Temp Source 11/16/22 1644 Oral     SpO2 11/16/22 1644 96 %     Weight --      Height --      Head Circumference --      Peak Flow --      Pain Score 11/16/22 1645 0     Pain Loc --      Pain Education --      Exclude from Growth Chart --    No data found.  Updated Vital Signs BP (!) 134/91 (BP Location: Right Arm)   Pulse 85   Temp 98.7 F (37.1 C) (Oral)   Resp 18   SpO2 96%   Visual Acuity Right Eye Distance:   Left Eye Distance:   Bilateral Distance:    Right Eye Near:   Left Eye Near:    Bilateral  Near:     Physical Exam Vitals and nursing note reviewed.  Constitutional:      Appearance: Normal appearance.  HENT:     Mouth/Throat:     Mouth: Mucous membranes are moist.     Pharynx: Oropharynx is clear.  Eyes:     Conjunctiva/sclera: Conjunctivae normal.  Cardiovascular:     Rate and Rhythm: Normal rate and regular rhythm.     Heart sounds: Normal heart sounds.  Pulmonary:     Effort: Pulmonary effort is normal.     Breath sounds: Normal breath sounds.  Abdominal:     General: Bowel sounds are normal.     Palpations: Abdomen is soft.     Tenderness: There is no abdominal tenderness. There is no right CVA tenderness, left CVA tenderness, guarding or rebound.  Musculoskeletal:        General: Normal range of motion.  Skin:    General: Skin is warm and dry.  Neurological:     Mental Status: He is alert and oriented to person, place, and time.     UC Treatments / Results  Labs (all labs ordered are listed, but only abnormal results are displayed) Labs Reviewed  POCT URINALYSIS DIP (MANUAL ENTRY) - Abnormal; Notable for the  following components:      Result Value   Protein Ur, POC =100 (*)    All other components within normal limits  POCT FASTING CBG [facility] MANUAL ENTRY    EKG   Radiology No results found.  Procedures Procedures (including critical care time)  Medications Ordered in UC Medications - No data to display  Initial Impression / Assessment and Plan / UC Course  I have reviewed the triage vital signs and the nursing notes.  Pertinent labs & imaging results that were available during my care of the patient were reviewed by me and considered in my medical decision making (see chart for details).  UA without infection. No glucose or ketones. A little protein. CBG 83 Discussed with patient. Advised contacting primary care provider to schedule follow up appointment. No red flags at this time. Can return with any new symptoms or concerns. Patient agreeable to plan  Final Clinical Impressions(s) / UC Diagnoses   Final diagnoses:  Urinary frequency     Discharge Instructions      Please contact your primary care provider first thing Monday morning for follow up. You can also scan the QR code on the last page to get established with a new primary care provider who may have sooner appointments  Please return with any concerns or new symptoms       ED Prescriptions   None    PDMP not reviewed this encounter.   Marlow Baars, [place] 11/16/22 1736

## 2022-11-16 NOTE — ED Triage Notes (Signed)
Pt c/o urine frequency and sweat smell for 3wks. Denies hx of DM.

## 2022-12-25 IMAGING — DX DG ANKLE 2V *L*
2 series · 2 of 2 positions shown · non-contrast
Comparison: None.

CLINICAL DATA: Left lateral foot/ankle pain

EXAM:
LEFT ANKLE - 2 VIEW

[ankle ap]
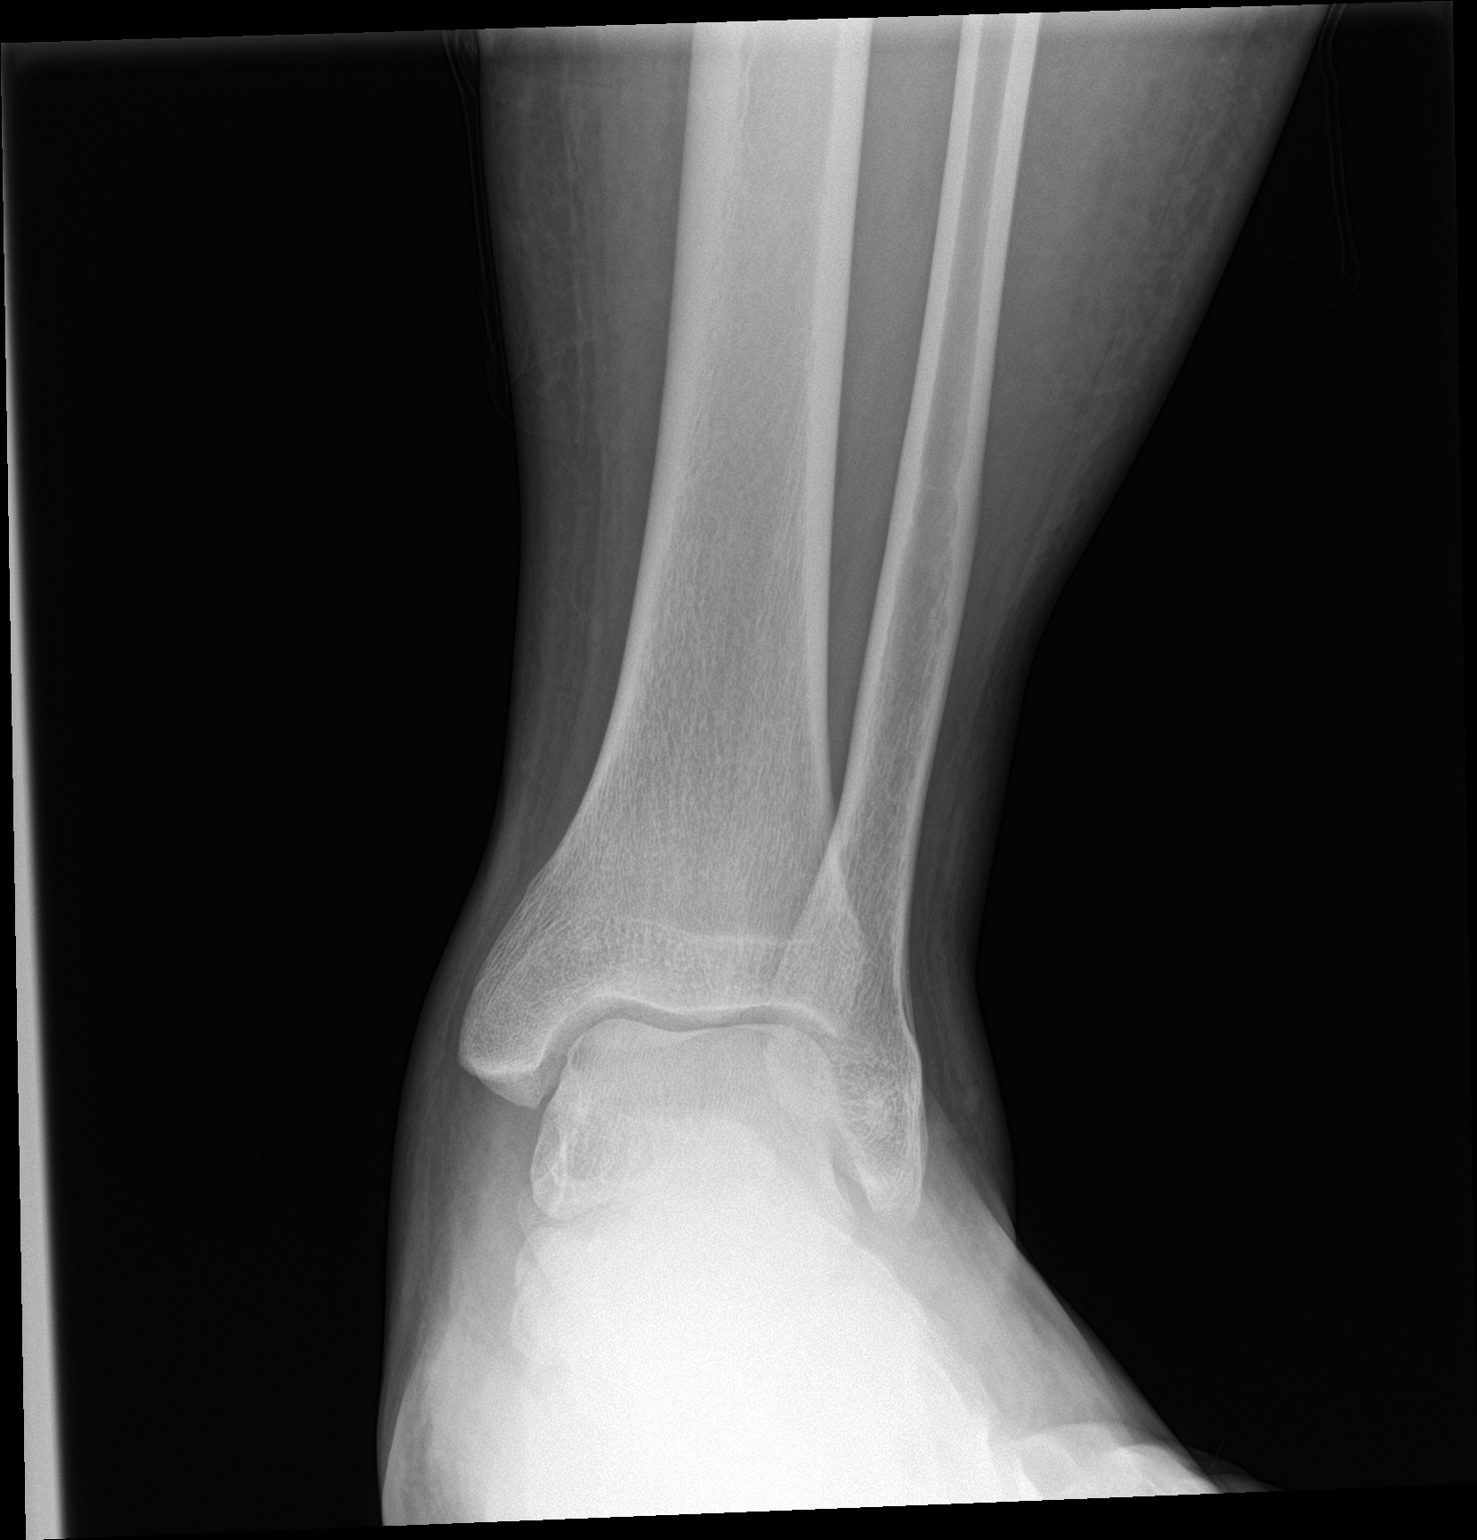

[ankle lat]
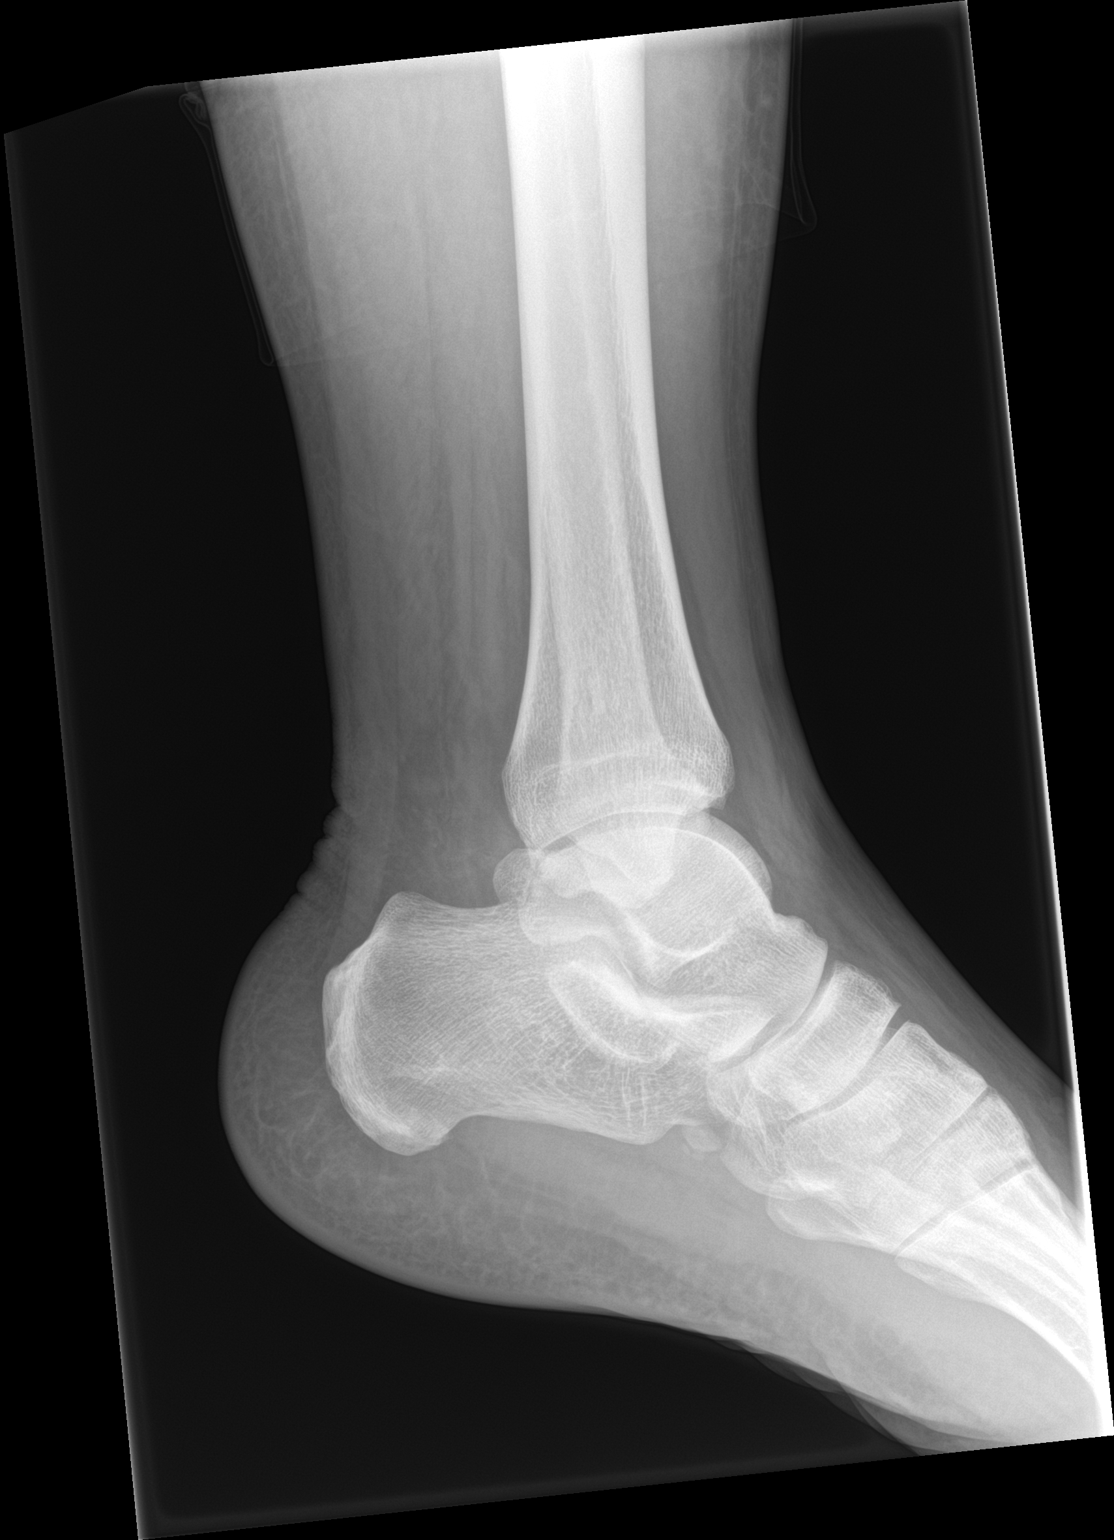

[2 of 2 positions shown; findings below may reference images not displayed]

FINDINGS: No fracture or dislocation is seen.

The ankle mortise is intact.

Mild lateral soft tissue swelling.
IMPRESSION: No fracture or dislocation is seen.

Mild lateral soft tissue swelling.

## 2023-01-11 ENCOUNTER — Telehealth: Payer: 59

## 2023-04-30 ENCOUNTER — Ambulatory Visit (INDEPENDENT_AMBULATORY_CARE_PROVIDER_SITE_OTHER)

## 2023-04-30 ENCOUNTER — Ambulatory Visit (HOSPITAL_COMMUNITY)
Admission: RE | Admit: 2023-04-30 | Discharge: 2023-04-30 | Disposition: A | Discharge status: Home / Self Care | Source: Ambulatory Visit | Attending: Emergency Medicine | Admitting: Emergency Medicine

## 2023-04-30 ENCOUNTER — Other Ambulatory Visit: Payer: Self-pay

## 2023-04-30 ENCOUNTER — Encounter (HOSPITAL_COMMUNITY): Payer: Self-pay

## 2023-04-30 VITALS — BP 160/85 | HR 81 | Temp 98.2°F | Resp 18

## 2023-04-30 DIAGNOSIS — R350 Frequency of micturition: Secondary | ICD-10-CM

## 2023-04-30 DIAGNOSIS — R109 Unspecified abdominal pain: Secondary | ICD-10-CM

## 2023-04-30 DIAGNOSIS — I10 Essential (primary) hypertension: Secondary | ICD-10-CM | POA: Diagnosis not present

## 2023-04-30 DIAGNOSIS — K5900 Constipation, unspecified: Secondary | ICD-10-CM

## 2023-04-30 LAB — POCT URINALYSIS DIP (MANUAL ENTRY)
Bilirubin, UA: NEGATIVE
Blood, UA: NEGATIVE
Glucose, UA: NEGATIVE mg/dL
Ketones, POC UA: NEGATIVE mg/dL
Leukocytes, UA: NEGATIVE
Nitrite, UA: NEGATIVE
Protein Ur, POC: NEGATIVE mg/dL
Spec Grav, UA: 1.02 (ref 1.010–1.025)
Urobilinogen, UA: 1 U/dL
pH, UA: 7 (ref 5.0–8.0)

## 2023-04-30 MED ORDER — AMLODIPINE BESYLATE 5 MG PO TABS: 5.0000 mg | ORAL_TABLET | Freq: Every day | ORAL | 2 refills | Status: AC

## 2023-04-30 NOTE — ED Triage Notes (Signed)
 Pt requesting to be seen for c/o urinary frequency.

## 2023-04-30 NOTE — ED Provider Notes (Signed)
 [facility]    CSN: [id number] Arrival date & time: 04/30/23  1456    HISTORY   Chief Complaint  Patient presents with   Urinary Frequency    Sudden urge to pee. I did see urine had protein in it. I would like to have blood work done. - Entered by patient   HPI Tyler Howard. is a pleasant, 33 y.o. male who presents to urgent care today. Patient states that recently he has noticed that he has been needing to urinate more urgently than usual.  Patient states he has also noticed that his last urinalysis, which according to his EMR was in November 24, had a high level of protein in it.  Patient is requesting evaluation of his urine today.  Urine dip today was completely normal.  Upon further questioning, patient admits to a history of intermittent episodes of constipation, denies burning with urination, increased frequency of urination, incontinence of urine.  Patient denies abdominal pain at this time.  Patient has elevated blood pressure on arrival and is morbidly obese.  Patient states he has never been treated for hypertension in the past.  Patient denies headache, vision changes, altered mental status, chest pain, shortness of breath.  The history is provided by the patient.  Urinary Frequency This is a new problem.   Past Medical History:  Diagnosis Date   GERD (gastroesophageal reflux disease)    Sleep apnea    Patient Active Problem List   Diagnosis Date Noted   OSA (obstructive sleep apnea) 03/14/2021   Lower back pain 07/23/2017   History reviewed. No pertinent surgical history.  Home Medications    Prior to Admission medications   Medication Sig Start Date End Date Taking? Authorizing Provider  amLODipine (NORVASC) 5 MG tablet Take 1 tablet (5 mg total) by mouth daily. 04/30/23 07/29/23 Yes Eloise Hake Scales, PA-C  clotrimazole (LOTRIMIN) 1 % external solution 2-3 drops in the right ear 3 times per day for 14 days 04/24/21   Cardama, Camila Cecil, MD  melatonin 1 MG TABS tablet Take 1 mg by mouth at bedtime.    [provider]  Multiple Vitamin (MULTIVITAMIN WITH MINERALS) TABS tablet Take 1 tablet by mouth daily.    [provider]    Family History Family History  Problem Relation Age of Onset   Healthy Mother    Depression Mother    Hypertension Mother    Healthy Father    Depression Sister    Autism Son    Heart disease Maternal Grandmother    Hyperlipidemia Maternal Grandmother    Alcohol abuse Maternal Grandfather    Cancer Maternal Grandfather    Alzheimer's disease Paternal Grandmother    Social History Social History   Tobacco Use   Smoking status: Never   Smokeless tobacco: Never  Vaping Use   Vaping status: Never Used  Substance Use Topics   Alcohol use: Yes   Drug use: No   Allergies   Patient has no known allergies.  Review of Systems Review of Systems  Genitourinary:  Positive for frequency.   Pertinent findings revealed after performing a 14 point review of systems has been noted in the history of present illness.  Physical Exam Vital Signs BP (!) 160/85 (BP Location: Right Arm)   Pulse 81   Temp 98.2 F (36.8 C) (Oral)   Resp 18   SpO2 98%   No data found.  Physical Exam Vitals and nursing note reviewed.  Constitutional:  General: He is not in acute distress.    Appearance: Normal appearance. He is morbidly obese. He is not ill-appearing.  HENT:     Head: Normocephalic and atraumatic.  Eyes:     Extraocular Movements: Extraocular movements intact.     Conjunctiva/sclera: Conjunctivae normal.     Pupils: Pupils are equal, round, and reactive to light.  Cardiovascular:     Rate and Rhythm: Normal rate and regular rhythm.     Heart sounds: Normal heart sounds, S1 normal and S2 normal.  Pulmonary:     Effort: Pulmonary effort is normal.     Breath sounds: Normal breath sounds and air entry.  Abdominal:     General: Abdomen is protuberant. Bowel  sounds are decreased.     Tenderness: There is no abdominal tenderness.  Musculoskeletal:        General: Normal range of motion.     Cervical back: Normal range of motion and neck supple.  Skin:    General: Skin is warm and dry.  Neurological:     General: No focal deficit present.     Mental Status: He is alert and oriented to person, place, and time. Mental status is at baseline.  Psychiatric:        Mood and Affect: Mood normal.        Behavior: Behavior normal.        Thought Content: Thought content normal.        Judgment: Judgment normal.     Visual Acuity Right Eye Distance:   Left Eye Distance:   Bilateral Distance:    Right Eye Near:   Left Eye Near:    Bilateral Near:     UC Couse / Diagnostics / Procedures:     Radiology No results found.  Procedures Procedures (including critical care time) EKG  Pending results:  Labs Reviewed  POCT URINALYSIS DIP (MANUAL ENTRY)    Medications Ordered in UC: Medications - No data to display  UC Diagnoses / Final Clinical Impressions(s)   I have reviewed the triage vital signs and the nursing notes.  Pertinent labs & imaging results that were available during my care of the patient were reviewed by me and considered in my medical decision making (see chart for details).    Final diagnoses:  Abdominal discomfort  Essential hypertension  Increased frequency of urination  Constipation, unspecified constipation type   Patient advised of x-ray findings concerning for constipation.  Bowel cleanout with MiraLAX and Gatorade recommended.  Patient provided with a prescription for amlodipine to reduce his blood pressure.  Patient provided with information regarding Dash eating plan and hypertension.  Exercise, increased water intake encouraged.  Conservative care recommended.  Return precautions advised.  Please see discharge instructions below for details of plan of care as provided to patient. ED Prescriptions      Medication Sig Dispense Auth. Provider   amLODipine (NORVASC) 5 MG tablet Take 1 tablet (5 mg total) by mouth daily. 30 tablet Eloise Hake Scales, PA-C      PDMP not reviewed this encounter.  Pending results:  Labs Reviewed  POCT URINALYSIS DIP (MANUAL ENTRY)      Discharge Instructions      The x-ray of your abdomen did reveal a moderate amount of retained stool meaning it is most likely that you are constipated.  This is also the most likely cause of your urinary discomfort at this time.  I recommend that you clear your bowels using one of the following  method:     Please purchase 32 ounces of your favorite flavor of Gatorade and a 250 g bottle of MiraLAX, also known as polyethylene glycol.  It is not important to buy the brand name, the generic version will do.  Pour the MiraLAX powder into the Gatorade and mix well.  When the MiraLAX powder is completely dissolved, please consume the entire 32 ounces within 1 hour.  Within the next 4 hours 4 hours, perhaps a little more or a little less, you should feel a significant urge to move your bowels and be able to produce a significant amount of stool.  If you feel that you have not had significant production of stool, please repeat this process and wait another 4 hours.   I have provided you with a prescription of amlodipine which I would like for you to begin taking once daily every morning to lower your blood pressure.    I have also also included some information in your visit summary about hypertension and the DASH diet which can help you reduce your blood pressure naturally.    Please be sure that you follow-up with your primary care provider within the next 30 days for repeat evaluation of your blood pressure and discussion of further management options.  Thank you for visiting [facility] today.  We appreciate the opportunity to participate in your care.     Disposition Upon Discharge:  Condition: stable for  discharge home  Patient presented with an acute illness with associated systemic symptoms and significant discomfort requiring urgent management. In my opinion, this is a condition that a prudent lay person (someone who possesses an average knowledge of health and medicine) may potentially expect to result in complications if not addressed urgently such as respiratory distress, impairment of bodily function or dysfunction of bodily organs.   Routine symptom specific, illness specific and/or disease specific instructions were discussed with the patient and/or caregiver at length.   As such, the patient has been evaluated and assessed, work-up was performed and treatment was provided in alignment with urgent care protocols and evidence based medicine.  Patient/parent/caregiver has been advised that the patient may require follow up for further testing and treatment if the symptoms continue in spite of treatment, as clinically indicated and appropriate.  Patient/parent/caregiver has been advised to return to the [facility] or PCP if no better; to PCP or the Emergency Department if new signs and symptoms develop, or if the current signs or symptoms continue to change or worsen for further workup, evaluation and treatment as clinically indicated and appropriate  The patient will follow up with their current PCP if and as advised. If the patient does not currently have a PCP we will assist them in obtaining one.   The patient may need specialty follow up if the symptoms continue, in spite of conservative treatment and management, for further workup, evaluation, consultation and treatment as clinically indicated and appropriate.  Patient/parent/caregiver verbalized understanding and agreement of plan as discussed.  All questions were addressed during visit.  Please see discharge instructions below for further details of plan.  This office note has been dictated using Teaching laboratory technician.   Unfortunately, this method of dictation can sometimes lead to typographical or grammatical errors.  I apologize for your inconvenience in advance if this occurs.  Please do not hesitate to reach out to me if clarification is needed.      Theadora Rama Scales, [place] 04/30/23 [phone]

## 2023-04-30 NOTE — Discharge Instructions (Addendum)
 The x-ray of your abdomen did reveal a moderate amount of retained stool meaning it is most likely that you are constipated.  This is also the most likely cause of your urinary discomfort at this time.  I recommend that you clear your bowels using one of the following method:     Please purchase 32 ounces of your favorite flavor of Gatorade and a 250 g bottle of MiraLAX, also known as polyethylene glycol.  It is not important to buy the brand name, the generic version will do.  Pour the MiraLAX powder into the Gatorade and mix well.  When the MiraLAX powder is completely dissolved, please consume the entire 32 ounces within 1 hour.  Within the next 4 hours 4 hours, perhaps a little more or a little less, you should feel a significant urge to move your bowels and be able to produce a significant amount of stool.  If you feel that you have not had significant production of stool, please repeat this process and wait another 4 hours.   I have provided you with a prescription of amlodipine which I would like for you to begin taking once daily every morning to lower your blood pressure.    I have also also included some information in your visit summary about hypertension and the DASH diet which can help you reduce your blood pressure naturally.    Please be sure that you follow-up with your primary care provider within the next 30 days for repeat evaluation of your blood pressure and discussion of further management options.  Thank you for visiting [facility] today.  We appreciate the opportunity to participate in your care.

## 2023-10-03 ENCOUNTER — Telehealth: Payer: Self-pay | Admitting: *Deleted

## 2023-10-03 NOTE — Telephone Encounter (Signed)
 Copied from CRM #[id number]. Topic: General - Other >> Oct 03, 2023 11:27 AM Mia F wrote: Reason for CRM: [employer] called to verify pt last office visit to receive DME supplies. They will inform pt to schedule an appt to get script

## 2023-10-03 NOTE — Telephone Encounter (Signed)
 Last seen in 01/2019. BJ

## 2023-10-23 ENCOUNTER — Encounter: Admitting: Family Medicine

## 2023-11-21 ENCOUNTER — Telehealth: Payer: Self-pay | Admitting: *Deleted

## 2023-11-21 NOTE — Telephone Encounter (Signed)
 Copied from CRM [id number]. Topic: General - Other >> Nov 21, 2023 10:44 AM Revonda D wrote: Reason for CRM: Marge with [employer] is wanted to verify if the office received the forms for the pt's CPAP supplies. Marge stated that she will be faxing over the forms again today and needs them signed and faxed back by tomorrow if possible.

## 2023-11-25 NOTE — Telephone Encounter (Signed)
 Based on records it does not seem like I am managing his OSA. Last visit, video, 01/21/19 for acute symptoms. Dxed with OSA by Dr Neda, pulmonologist, who he saw over a year ago. He needs to schedule f/u appt with pulmonologist. If he needs another referral he needs to be seen. BJ

## 2023-11-25 NOTE — Telephone Encounter (Signed)
 Attempted to reach patient. Left voicemail for patient to return my call.

## 2024-02-18 ENCOUNTER — Ambulatory Visit: Admitting: Family Medicine

## 2024-02-18 DIAGNOSIS — I10 Essential (primary) hypertension: Secondary | ICD-10-CM | POA: Insufficient documentation
